# Patient Record
Sex: Female | Born: 1975 | Race: White | Hispanic: No | Marital: Married | Smoking: Never smoker
Health system: Southern US, Community
[De-identification: ages and names within clinical notes are randomized; demographics above are authoritative.]

## PROBLEM LIST (undated history)

## (undated) DIAGNOSIS — K429 Umbilical hernia without obstruction or gangrene: Principal | ICD-10-CM

## (undated) HISTORY — DX: Umbilical hernia without obstruction or gangrene: K42.9

## (undated) HISTORY — PX: TUBAL LIGATION: SHX77

---

## 2004-12-06 ENCOUNTER — Ambulatory Visit: Payer: Self-pay | Admitting: Family Medicine

## 2005-01-13 ENCOUNTER — Ambulatory Visit: Payer: Self-pay | Admitting: Family Medicine

## 2005-01-13 ENCOUNTER — Other Ambulatory Visit: Admission: RE | Admit: 2005-01-13 | Discharge: 2005-01-13 | Payer: Self-pay | Admitting: Family Medicine

## 2005-01-23 ENCOUNTER — Ambulatory Visit: Payer: Self-pay | Admitting: Family Medicine

## 2005-12-10 ENCOUNTER — Ambulatory Visit: Payer: Self-pay | Admitting: Family Medicine

## 2006-01-15 ENCOUNTER — Other Ambulatory Visit: Admission: RE | Admit: 2006-01-15 | Discharge: 2006-01-15 | Payer: Self-pay | Admitting: Family Medicine

## 2006-01-15 ENCOUNTER — Ambulatory Visit: Payer: Self-pay | Admitting: Family Medicine

## 2006-01-15 ENCOUNTER — Encounter (INDEPENDENT_AMBULATORY_CARE_PROVIDER_SITE_OTHER): Payer: Self-pay | Admitting: *Deleted

## 2006-08-21 ENCOUNTER — Ambulatory Visit (HOSPITAL_COMMUNITY): Admission: RE | Admit: 2006-08-21 | Discharge: 2006-08-21 | Payer: Self-pay | Admitting: Obstetrics and Gynecology

## 2006-10-01 ENCOUNTER — Inpatient Hospital Stay (HOSPITAL_COMMUNITY): Admission: RE | Admit: 2006-10-01 | Discharge: 2006-10-03 | Payer: Self-pay | Admitting: Obstetrics and Gynecology

## 2007-04-13 ENCOUNTER — Encounter: Payer: Self-pay | Admitting: Obstetrics and Gynecology

## 2007-04-13 ENCOUNTER — Ambulatory Visit: Payer: Self-pay | Admitting: Obstetrics and Gynecology

## 2007-11-16 ENCOUNTER — Ambulatory Visit: Payer: Self-pay | Admitting: Family Medicine

## 2007-11-16 DIAGNOSIS — E7800 Pure hypercholesterolemia, unspecified: Secondary | ICD-10-CM

## 2007-11-16 LAB — CONVERTED CEMR LAB
ALT: 8 units/L (ref 0–35)
AST: 10 units/L (ref 0–37)
Albumin: 4.4 g/dL (ref 3.5–5.2)
Cholesterol: 204 mg/dL — ABNORMAL HIGH (ref 0–200)
Glucose, Bld: 100 mg/dL — ABNORMAL HIGH (ref 70–99)
Potassium: 4.8 meq/L (ref 3.5–5.3)
Sodium: 140 meq/L (ref 135–145)
Total CHOL/HDL Ratio: 4.7
Total Protein: 7 g/dL (ref 6.0–8.3)

## 2007-11-17 ENCOUNTER — Encounter: Payer: Self-pay | Admitting: Family Medicine

## 2008-01-18 ENCOUNTER — Ambulatory Visit: Payer: Self-pay | Admitting: Obstetrics and Gynecology

## 2008-01-19 ENCOUNTER — Encounter: Payer: Self-pay | Admitting: Family Medicine

## 2008-02-18 ENCOUNTER — Ambulatory Visit: Payer: Self-pay | Admitting: Physician Assistant

## 2008-02-23 ENCOUNTER — Ambulatory Visit (HOSPITAL_COMMUNITY): Admission: RE | Admit: 2008-02-23 | Discharge: 2008-02-23 | Payer: Self-pay | Admitting: Obstetrics & Gynecology

## 2008-03-17 ENCOUNTER — Ambulatory Visit: Payer: Self-pay | Admitting: Physician Assistant

## 2008-03-22 ENCOUNTER — Ambulatory Visit (HOSPITAL_COMMUNITY): Admission: RE | Admit: 2008-03-22 | Discharge: 2008-03-22 | Payer: Self-pay | Admitting: Obstetrics & Gynecology

## 2008-04-05 ENCOUNTER — Ambulatory Visit (HOSPITAL_COMMUNITY): Admission: RE | Admit: 2008-04-05 | Discharge: 2008-04-05 | Payer: Self-pay | Admitting: Obstetrics & Gynecology

## 2008-04-14 ENCOUNTER — Encounter: Payer: Self-pay | Admitting: Family

## 2008-04-14 ENCOUNTER — Ambulatory Visit: Payer: Self-pay | Admitting: Family

## 2008-05-19 ENCOUNTER — Ambulatory Visit: Payer: Self-pay | Admitting: Family

## 2008-06-09 ENCOUNTER — Ambulatory Visit: Payer: Self-pay | Admitting: Family

## 2008-06-09 LAB — CONVERTED CEMR LAB
HCT: 35 % — ABNORMAL LOW (ref 36.0–46.0)
Hemoglobin: 11.3 g/dL — ABNORMAL LOW (ref 12.0–15.0)
Platelets: 263 10*3/uL (ref 150–400)
RBC: 3.65 M/uL — ABNORMAL LOW (ref 3.87–5.11)
WBC: 8.6 10*3/uL (ref 4.0–10.5)

## 2008-06-23 ENCOUNTER — Ambulatory Visit: Payer: Self-pay | Admitting: Obstetrics and Gynecology

## 2008-07-10 ENCOUNTER — Ambulatory Visit: Payer: Self-pay | Admitting: Obstetrics and Gynecology

## 2008-07-24 ENCOUNTER — Ambulatory Visit: Payer: Self-pay | Admitting: Family

## 2008-08-07 ENCOUNTER — Ambulatory Visit: Payer: Self-pay | Admitting: Family

## 2008-08-08 ENCOUNTER — Encounter: Payer: Self-pay | Admitting: Family

## 2008-08-08 LAB — CONVERTED CEMR LAB: GC Probe Amp, Genital: NEGATIVE

## 2008-08-09 ENCOUNTER — Encounter: Payer: Self-pay | Admitting: Family

## 2008-08-14 ENCOUNTER — Ambulatory Visit: Payer: Self-pay | Admitting: Obstetrics and Gynecology

## 2008-08-21 ENCOUNTER — Ambulatory Visit: Payer: Self-pay | Admitting: Family

## 2008-08-28 ENCOUNTER — Ambulatory Visit: Payer: Self-pay | Admitting: Obstetrics and Gynecology

## 2008-09-04 ENCOUNTER — Ambulatory Visit: Payer: Self-pay | Admitting: Family

## 2008-09-04 ENCOUNTER — Ambulatory Visit (HOSPITAL_COMMUNITY): Admission: RE | Admit: 2008-09-04 | Discharge: 2008-09-04 | Payer: Self-pay | Admitting: Obstetrics & Gynecology

## 2008-09-06 ENCOUNTER — Ambulatory Visit: Payer: Self-pay | Admitting: Obstetrics & Gynecology

## 2008-09-06 ENCOUNTER — Inpatient Hospital Stay (HOSPITAL_COMMUNITY): Admission: RE | Admit: 2008-09-06 | Discharge: 2008-09-10 | Payer: Self-pay | Admitting: Obstetrics & Gynecology

## 2008-09-06 ENCOUNTER — Ambulatory Visit: Payer: Self-pay | Admitting: Family Medicine

## 2008-10-27 ENCOUNTER — Ambulatory Visit: Payer: Self-pay | Admitting: Physician Assistant

## 2009-06-04 ENCOUNTER — Telehealth: Payer: Self-pay | Admitting: Family Medicine

## 2009-07-17 ENCOUNTER — Ambulatory Visit: Payer: Self-pay | Admitting: Obstetrics & Gynecology

## 2009-07-17 ENCOUNTER — Encounter: Payer: Self-pay | Admitting: Physician Assistant

## 2009-07-17 LAB — CONVERTED CEMR LAB
Antibody Screen: NEGATIVE
Basophils Absolute: 0 10*3/uL (ref 0.0–0.1)
Eosinophils Absolute: 0.1 10*3/uL (ref 0.0–0.7)
Lymphocytes Relative: 25 % (ref 12–46)
Lymphs Abs: 1.9 10*3/uL (ref 0.7–4.0)
MCV: 94.9 fL (ref 78.0–100.0)
Monocytes Absolute: 0.4 10*3/uL (ref 0.1–1.0)
Neutro Abs: 5.2 10*3/uL (ref 1.7–7.7)
RBC: 4.53 M/uL (ref 3.87–5.11)
RDW: 13.2 % (ref 11.5–15.5)

## 2009-07-18 ENCOUNTER — Encounter: Payer: Self-pay | Admitting: Physician Assistant

## 2009-07-26 IMAGING — US US FETAL BPP W/O NONSTRESS
1 series · 5 of 5 positions shown · non-contrast
Comparison: none

OBSTETRICAL ULTRASOUND:
 This ultrasound exam was performed in the [HOSPITAL] Ultrasound Department.  The OB US report was generated in the AS system, and faxed to the ordering physician.  This report is also available in [REDACTED] PACS.

[Series 1: us fetal bpp w/o nonstress · non-contrast · 5 acquisitions, 5 frames shown]
[im 1/5]
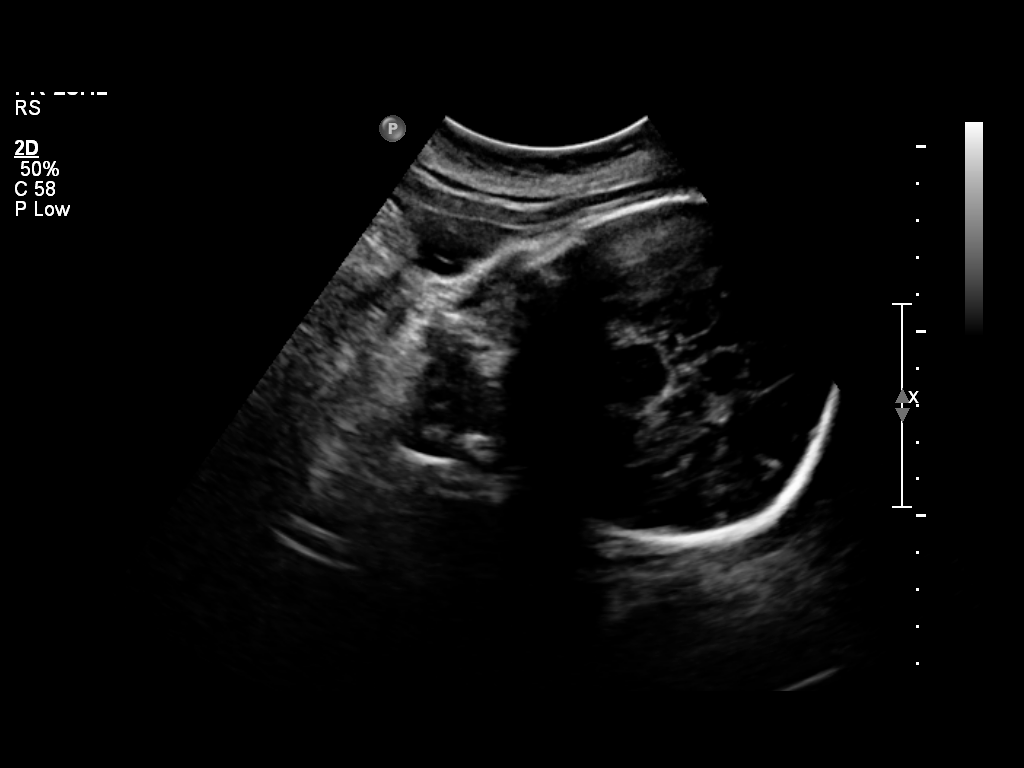
[im 2/5]
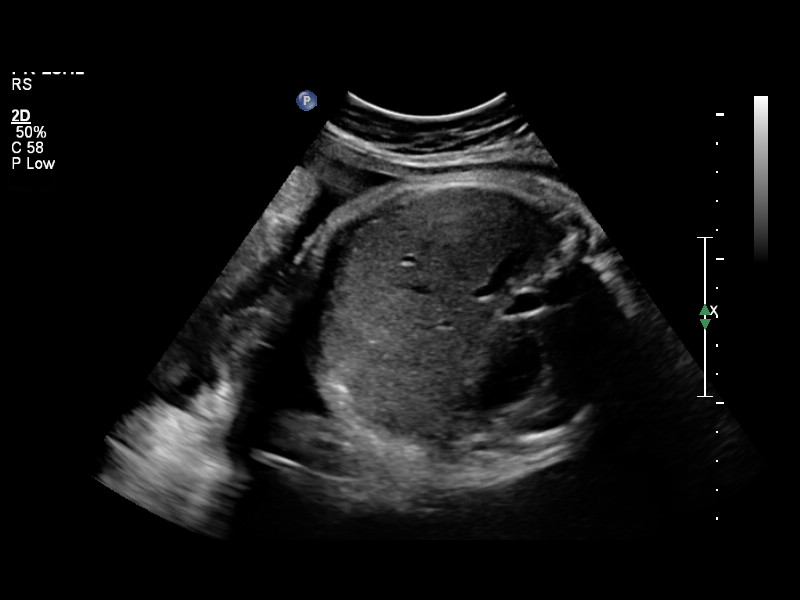
[im 3/5]
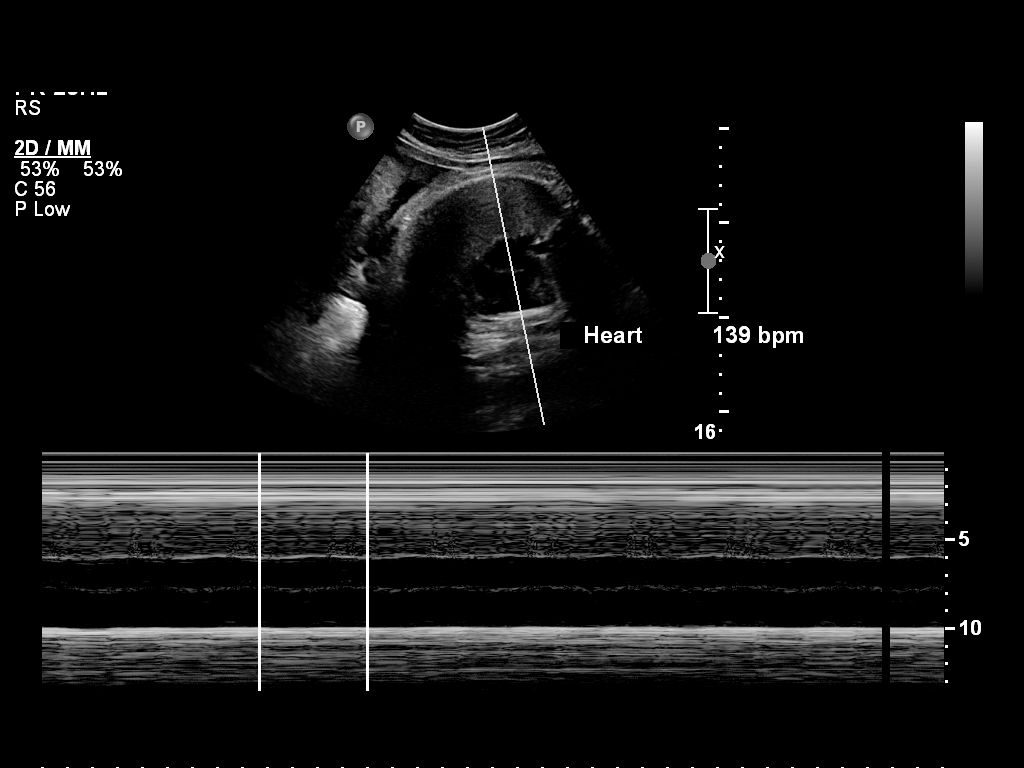
[im 4/5]
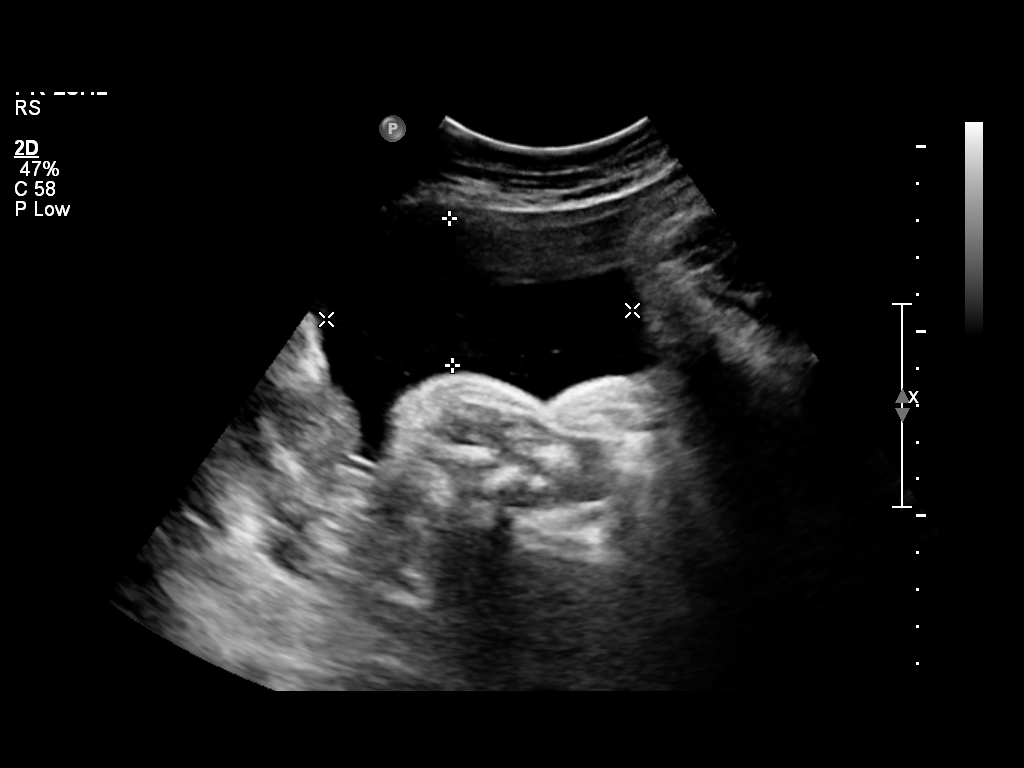
[im 5/5]
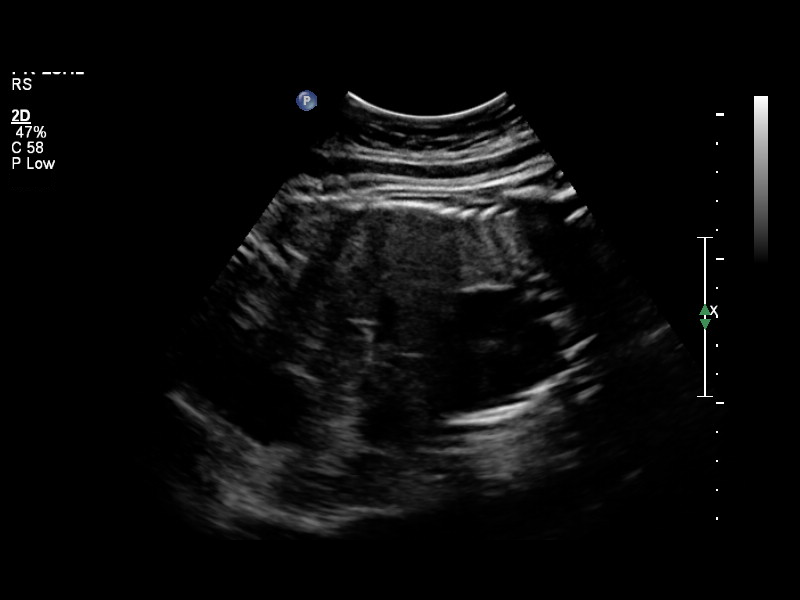

[5 of 5 positions shown; findings below may reference images not displayed]

IMPRESSION: See AS Obstetric US report.

## 2009-07-27 ENCOUNTER — Ambulatory Visit: Payer: Self-pay | Admitting: Physician Assistant

## 2009-08-09 ENCOUNTER — Encounter: Payer: Self-pay | Admitting: Obstetrics and Gynecology

## 2009-08-20 ENCOUNTER — Ambulatory Visit (HOSPITAL_COMMUNITY): Admission: RE | Admit: 2009-08-20 | Discharge: 2009-08-20 | Payer: Self-pay | Admitting: Obstetrics & Gynecology

## 2009-08-24 ENCOUNTER — Ambulatory Visit: Payer: Self-pay | Admitting: Advanced Practice Midwife

## 2009-09-24 ENCOUNTER — Ambulatory Visit: Payer: Self-pay | Admitting: Family

## 2009-10-01 ENCOUNTER — Ambulatory Visit (HOSPITAL_COMMUNITY): Admission: RE | Admit: 2009-10-01 | Discharge: 2009-10-01 | Payer: Self-pay | Admitting: Obstetrics & Gynecology

## 2009-10-26 ENCOUNTER — Ambulatory Visit: Payer: Self-pay | Admitting: Advanced Practice Midwife

## 2009-11-20 ENCOUNTER — Ambulatory Visit: Payer: Self-pay | Admitting: Obstetrics & Gynecology

## 2009-11-27 ENCOUNTER — Ambulatory Visit (HOSPITAL_COMMUNITY): Admission: RE | Admit: 2009-11-27 | Discharge: 2009-11-27 | Payer: Self-pay | Admitting: Obstetrics & Gynecology

## 2009-12-14 ENCOUNTER — Ambulatory Visit: Payer: Self-pay | Admitting: Physician Assistant

## 2009-12-15 ENCOUNTER — Encounter: Payer: Self-pay | Admitting: Physician Assistant

## 2009-12-15 LAB — CONVERTED CEMR LAB
HCT: 37.8 % (ref 36.0–46.0)
Hemoglobin: 11.7 g/dL — ABNORMAL LOW (ref 12.0–15.0)
WBC: 7.6 10*3/uL (ref 4.0–10.5)

## 2009-12-21 ENCOUNTER — Encounter: Payer: Self-pay | Admitting: Obstetrics & Gynecology

## 2009-12-28 ENCOUNTER — Ambulatory Visit: Payer: Self-pay | Admitting: Family

## 2010-01-01 ENCOUNTER — Ambulatory Visit (HOSPITAL_COMMUNITY): Admission: RE | Admit: 2010-01-01 | Discharge: 2010-01-01 | Payer: Self-pay | Admitting: Obstetrics & Gynecology

## 2010-01-01 ENCOUNTER — Encounter: Admission: RE | Admit: 2010-01-01 | Discharge: 2010-02-28 | Payer: Self-pay | Admitting: Obstetrics & Gynecology

## 2010-01-04 ENCOUNTER — Ambulatory Visit: Payer: Self-pay | Admitting: Advanced Practice Midwife

## 2010-01-11 ENCOUNTER — Ambulatory Visit: Payer: Self-pay | Admitting: Physician Assistant

## 2010-01-23 ENCOUNTER — Ambulatory Visit: Payer: Self-pay | Admitting: Obstetrics & Gynecology

## 2010-02-08 ENCOUNTER — Ambulatory Visit: Payer: Self-pay | Admitting: Family

## 2010-02-08 ENCOUNTER — Encounter: Payer: Self-pay | Admitting: Physician Assistant

## 2010-02-08 LAB — CONVERTED CEMR LAB: GC Probe Amp, Genital: NEGATIVE

## 2010-02-15 ENCOUNTER — Ambulatory Visit: Payer: Self-pay | Admitting: Physician Assistant

## 2010-02-16 ENCOUNTER — Encounter: Payer: Self-pay | Admitting: Physician Assistant

## 2010-02-16 LAB — CONVERTED CEMR LAB
AST: 18 units/L (ref 0–37)
Albumin: 3.1 g/dL — ABNORMAL LOW (ref 3.5–5.2)
CO2: 22 meq/L (ref 19–32)
Creatinine, Urine: 168.6 mg/dL
MCHC: 31.8 g/dL (ref 30.0–36.0)
MCV: 101.5 fL — ABNORMAL HIGH (ref 78.0–100.0)
Platelets: 194 10*3/uL (ref 150–400)
RBC: 3.94 M/uL (ref 3.87–5.11)
RDW: 14.1 % (ref 11.5–15.5)
Total Protein: 5.5 g/dL — ABNORMAL LOW (ref 6.0–8.3)

## 2010-02-22 ENCOUNTER — Ambulatory Visit: Payer: Self-pay | Admitting: Obstetrics and Gynecology

## 2010-02-28 ENCOUNTER — Inpatient Hospital Stay (HOSPITAL_COMMUNITY): Admission: RE | Admit: 2010-02-28 | Discharge: 2010-03-02 | Payer: Self-pay | Admitting: Family Medicine

## 2010-02-28 ENCOUNTER — Ambulatory Visit: Payer: Self-pay | Admitting: Family Medicine

## 2010-02-28 ENCOUNTER — Other Ambulatory Visit: Payer: Self-pay | Admitting: Obstetrics & Gynecology

## 2010-03-05 ENCOUNTER — Ambulatory Visit: Payer: Self-pay | Admitting: Obstetrics & Gynecology

## 2010-03-06 ENCOUNTER — Ambulatory Visit: Admission: RE | Admit: 2010-03-06 | Discharge: 2010-03-06 | Payer: Self-pay | Admitting: Family Medicine

## 2010-04-09 ENCOUNTER — Ambulatory Visit: Payer: Self-pay | Admitting: Obstetrics & Gynecology

## 2010-04-23 ENCOUNTER — Encounter: Payer: Self-pay | Admitting: Physician Assistant

## 2010-04-23 LAB — CONVERTED CEMR LAB
Glucose, 2 hour: 87 mg/dL (ref 70–139)
Glucose, Fasting: 68 mg/dL — ABNORMAL LOW (ref 70–99)

## 2010-06-23 ENCOUNTER — Encounter: Payer: Self-pay | Admitting: Obstetrics & Gynecology

## 2010-06-23 ENCOUNTER — Encounter: Payer: Self-pay | Admitting: Diagnostic Radiology

## 2010-07-02 NOTE — Progress Notes (Signed)
Summary: Rx for pink eye  Phone Note Call from Patient   Caller: Patient Summary of Call: Pt LMOM requesting eye drops for pink eye. Pt states she in unable to leave work for OV and husband is bringing child in apt w/ you this afternoon. Please advise.  Initial call taken by: Payton Spark CMA,  June 04, 2009 12:18 PM    New/Updated Medications: POLYTRIM 10000-0.1 UNIT/ML-% SOLN (POLYMYXIN B-TRIMETHOPRIM) 1 drop in each eye q 3 hrs x 7 days Prescriptions: POLYTRIM 10000-0.1 UNIT/ML-% SOLN (POLYMYXIN B-TRIMETHOPRIM) 1 drop in each eye q 3 hrs x 7 days  #1 bottle x 0   Entered and Authorized by:   Seymour Bars DO   Signed by:   Seymour Bars DO on 06/04/2009   Method used:   Electronically to        [facility] [phone]* (retail)       [address]       [place], [place]  [address]       Ph: [phone] or [phone]       Fax: [phone]   RxID:   [id number]

## 2010-08-15 LAB — BASIC METABOLIC PANEL
BUN: 10 mg/dL (ref 6–23)
CO2: 24 mEq/L (ref 19–32)
Calcium: 8.9 mg/dL (ref 8.4–10.5)
Creatinine, Ser: 0.73 mg/dL (ref 0.4–1.2)
GFR calc Af Amer: >60 mL/min (ref >60)
Glucose, Bld: 78 mg/dL (ref 70–99)

## 2010-08-15 LAB — CBC
HCT: 38.9 % (ref 36.0–46.0)
Hemoglobin: 11 g/dL — ABNORMAL LOW (ref 12.0–15.0)
MCH: 33.6 pg (ref 26.0–34.0)
MCH: 34.5 pg — ABNORMAL HIGH (ref 26.0–34.0)
MCHC: 34.2 g/dL (ref 30.0–36.0)
MCV: 98.2 fL (ref 78.0–100.0)
MCV: 99.3 fL (ref 78.0–100.0)
RBC: 3.18 MIL/uL — ABNORMAL LOW (ref 3.87–5.11)
RDW: 13 % (ref 11.5–15.5)
WBC: 6.8 10*3/uL (ref 4.0–10.5)

## 2010-08-15 LAB — CROSSMATCH

## 2010-08-15 LAB — SYPHILIS: RPR W/REFLEX TO RPR TITER AND TREPONEMAL ANTIBODIES, TRADITIONAL SCREENING AND DIAGNOSIS ALGORITHM: RPR Ser Ql: NONREACTIVE

## 2010-08-15 LAB — ABO/RH: ABO/RH(D): O POS

## 2010-08-15 LAB — GLUCOSE, CAPILLARY: Glucose-Capillary: 91 mg/dL (ref 70–99)

## 2010-08-15 LAB — SURGICAL PCR SCREEN: MRSA, PCR: NEGATIVE

## 2010-09-11 LAB — CBC
HCT: 37 % (ref 36.0–46.0)
HCT: 37.9 % (ref 36.0–46.0)
Hemoglobin: 10.2 g/dL — ABNORMAL LOW (ref 12.0–15.0)
Hemoglobin: 12.6 g/dL (ref 12.0–15.0)
MCHC: 33.5 g/dL (ref 30.0–36.0)
MCHC: 34 g/dL (ref 30.0–36.0)
MCV: 96.8 fL (ref 78.0–100.0)
Platelets: 199 10*3/uL (ref 150–400)
Platelets: 242 10*3/uL (ref 150–400)
Platelets: 250 10*3/uL (ref 150–400)
RDW: 13.7 % (ref 11.5–15.5)
RDW: 14.1 % (ref 11.5–15.5)
RDW: 14.5 % (ref 11.5–15.5)
WBC: 13.1 10*3/uL — ABNORMAL HIGH (ref 4.0–10.5)
WBC: 7.5 10*3/uL (ref 4.0–10.5)

## 2010-09-11 LAB — COMPREHENSIVE METABOLIC PANEL
ALT: 14 U/L (ref 0–35)
Albumin: 2.7 g/dL — ABNORMAL LOW (ref 3.5–5.2)
Alkaline Phosphatase: 119 U/L — ABNORMAL HIGH (ref 39–117)
BUN: 6 mg/dL (ref 6–23)
Chloride: 105 mEq/L (ref 96–112)
Potassium: 3.9 mEq/L (ref 3.5–5.1)
Sodium: 133 mEq/L — ABNORMAL LOW (ref 135–145)
Total Bilirubin: 0.2 mg/dL — ABNORMAL LOW (ref 0.3–1.2)
Total Protein: 6 g/dL (ref 6.0–8.3)

## 2010-09-11 LAB — URINALYSIS, ROUTINE W REFLEX MICROSCOPIC
Bilirubin Urine: NEGATIVE
Glucose, UA: NEGATIVE mg/dL
Ketones, ur: NEGATIVE mg/dL
Leukocytes, UA: NEGATIVE
Specific Gravity, Urine: 1.01 (ref 1.005–1.030)
pH: 6.5 (ref 5.0–8.0)

## 2010-09-11 LAB — URINE MICROSCOPIC-ADD ON

## 2010-09-11 LAB — MAGNESIUM: Magnesium: 5.8 mg/dL — ABNORMAL HIGH (ref 1.5–2.5)

## 2010-09-11 LAB — RPR: RPR Ser Ql: NONREACTIVE

## 2010-10-01 ENCOUNTER — Inpatient Hospital Stay (HOSPITAL_COMMUNITY): Admission: AD | Admit: 2010-10-01 | Payer: Self-pay | Source: Home / Self Care | Admitting: Obstetrics & Gynecology

## 2010-10-15 NOTE — Assessment & Plan Note (Signed)
Caitlyn Perez, Caitlyn Perez             ACCOUNT NO.:  [id number]   MEDICAL RECORD NO.:  [id number]          PATIENT TYPE:  POB   LOCATION:  [facility] at [place]         FACILITY:  [facility]   PHYSICIAN:  Allie Bossier, MD        DATE OF BIRTH:  [birth year]   DATE OF SERVICE:  04/09/2010                                  CLINIC NOTE   HISTORY OF PRESENT ILLNESS:  Caitlyn Perez is a 35 year old married white G2,  P2 who is 5 weeks postop status post repeat cesarean section as well as  tubal ligation.  She comes in here for her scheduled followup.  She has  no GYN complaints.  She did have a question about breast-feeding.  The  baby is no longer technically breast-feeding; however, she is pumping  every 3-4 hours and feeding him through the bottle.  She had a question  of milk gland and/or duct in her left breast.  She said she was still a  little sore for the last day.  She denies baby blues.  She has not had  intercourse.  She denies bowel or bladder dysfunction.  Physical exam,  both breasts are normal appearing, lactating breasts.  I do not detect  any areas of current mastitis; however, her incision is very well-  healed.  Her abdomen is benign.  Her uterus is well involuted.   ASSESSMENT/PLAN:  1. Postpartum postop stable.  2. Breast pain.   I have recommended hot compresses and continue pumping, and she should  return should she develop redness or fever in her breast.  Because she  had gestational diabetes in her pregnancy, she will come back in the  next several weeks for a 2-hour Glucola and her Pap smear is due to be  repeated in March of next year.      Allie Bossier, MD     MCD/MEDQ  D:  04/09/2010  T:  04/10/2010  Job:  [id number]

## 2010-10-15 NOTE — Discharge Summary (Signed)
Caitlyn, GALLOP             ACCOUNT NO.:  [id number]   MEDICAL RECORD NO.:  [id number]          PATIENT TYPE:  INP   LOCATION:  9109                          FACILITY:  [facility]   PHYSICIAN:  Lazaro Arms, M.D.   DATE OF BIRTH:  [birth year]   DATE OF ADMISSION:  09/06/2008  DATE OF DISCHARGE:  09/10/2008                               DISCHARGE SUMMARY   REASON FOR ADMISSION:  Pregnancy at 40 plus weeks, for induction of  labor.   DISCHARGE DIAGNOSIS:  Term pregnancy, primary low transverse cesarean  section by Dr. Silas Flood for failure to progress.   HOSPITAL COURSE:  The patient labored to approximately until 6-7 cm and  failed to pass that point and at that time showed a low transverse  cesarean section by Dr. Billy Coast and Dr. Silas Flood.  The patient was noted to  have blood pressure elevations in recovery room.  At that time, she was  started on mag sulfate, transferred to ICU for 24 hours.  The patient  responded well to mag sulfate therapy.  Blood pressures since discharged  24 hours ago have been in the 110s/70s and hemoglobin is 10.3.  The  patient is up, ambulating well, taking p.o. fluids and solids well,  emptying her bladder well and has had 2 bowel movements since her  surgery.   PHYSICAL EXAMINATION:  HEART:  Regular to rhythm and rate.  LUNGS:  Clear to auscultation bilaterally.  ABDOMEN:  Soft and nontender.  Bowel sounds are present in all 4  quadrants.  Fundus is firm at -3.  Lochia, scant amount.  EXTREMITIES:  Negative Homans, 2+ DTRs.  There is no pitting edema  noted.   DISCHARGE MEDICATIONS:  1. Prevagen Forte 1 p.o. b.i.d.  2. Percocet 5/325 one p.o. q.4-6 h. p.r.n. pain.  3. Motrin 600 one p.o. q.6 h. p.r.n. cramping.   DISCHARGE FOLLOWUP:  She is to follow up at [facility] office in 6  weeks or p.r.n. as needed.      [name], N.M.      Lazaro Arms, M.D.  Electronically Signed    DL/MEDQ  D:  95/[id number]/[id number]  T:  09/11/2008  Job:  [id number]   cc:   Norton Blizzard, MD

## 2010-10-15 NOTE — Assessment & Plan Note (Signed)
Caitlyn Perez, Caitlyn Perez             ACCOUNT NO.:  [id number]   MEDICAL RECORD NO.:  [id number]          PATIENT TYPE:  POB   LOCATION:  [facility] at [facility] [facility]         FACILITY:  [facility]   PHYSICIAN:  Caren Griffins, CNM       DATE OF BIRTH:  Apr 17, 1976   DATE OF SERVICE:  04/13/2007                                  CLINIC NOTE   REASON FOR VISIT:  Annual GYN exam.   HISTORY:  This is a 35 year old nulliparous female who presents for  routine visit and Pap smear.  Last Pap was August 2007 and was normal,  she has never had an abnormal Pap.  She was married one year ago, at  which time she moved to this area.  Uses condoms for contraception, has  no concerns other than questioning the normalcy that she has a light  flow of menses but this has been unchanged over several years.   ALLERGIES:  NONE.   MEDICATIONS:  None, she does take multivitamins with calcium but often  forgets.   IMMUNIZATIONS:  She has had the usual childhood immunizations and has  had tetanus in the last 10 years.  Declines flu vaccine.   MENSTRUAL HISTORY:  LMP March 19, 2007, menses 13 times 23 times 4  with light flow and moderate dysmenorrhea, she has no intermenstrual  bleeding.   CONTRACEPTION HISTORY:  Has not used hormonal contraception.  She has  had 5 lifetime partners.  Has been married for one year in a mutually  monogamous relationship.   GYN HISTORY:  As above and negative for STIs.   SURGERIES:  None.   FAMILY HISTORY:  Significant for diabetes, cardiovascular disease, MI,  hypertension and lung cancer in grandmother.   PERSONAL MEDICAL HISTORY:  She states that her cholesterol was elevated  when she was tested by her primary MD in [place].  She plans on  transferring her primary care to this area and declines lipid testing  today.  PMH is otherwise negative.   SOCIAL HISTORY:  Lives with husband, is concerned about her finances  having mortgages on 2 homes now.  She does work in [employer] as an  Engineer, manufacturing.  She is a nonsmoker, drinks  alcohol 1-2 drinks a week, caffeine once a day.  No illicit drug use or  abuse history.   Ten point review of systems is negative.   PHYSICAL EXAMINATION:  VITAL SIGNS:  Temperature 98.6, pulse 74, blood  pressure 128/81, BMI is 25.  GENERAL:  WNWD, pleasant WF in NAD.  HEENT:  Dentition good; thyroid smooth, nonpalpable.  LUNGS:  Clear to auscultation bilaterally.  CORONARY:  RRR without murmur.  BREASTS:  Symmetric, no discrete mass, no lymphadenopathy, no nipple  discharge.  ABDOMEN:  Soft, flat, nontender without masses or organomegaly.  PELVIC:  NEFG, BUS negative.  Vagina pink, well rugated.  Cervix  nulliparous, no lesions.  Bimanual:  Uterus NSSP, adnexa no tenderness  or masses.  EXTREMITIES:  Trace sock line edema; pulses equal, strong bilaterally.   ASSESSMENT:  Normal gynecological exam.   PLAN:  Counseled on continuing a healthy diet, continuing her cardio  exercising and increasing her weight  training which she does rarely.  Discussed baseline mammogram age 41, she is undecided.  Pap smear sent  today, other STI testing is declined.  Breast self exam reviewed.  Continue seat belts.  Discussed her contraceptive and its poor use  effectiveness, she is not interested in other methods at this time.           ______________________________  Caren Griffins, CNM     DP/MEDQ  D:  04/13/2007  T:  04/14/2007  Job:  [id number]

## 2010-10-15 NOTE — Assessment & Plan Note (Signed)
Caitlyn Perez, Caitlyn Perez             ACCOUNT NO.:  [id number]   MEDICAL RECORD NO.:  [id number]          PATIENT TYPE:  POB   LOCATION:  [facility] [place]         FACILITY:  [facility]   PHYSICIAN:  Maylon Cos, CNM    DATE OF BIRTH:  1975-08-22   DATE OF SERVICE:  10/27/2008                                  CLINIC NOTE   The patient presents for a 6-week postpartum visit with no complaints.   HISTORY OF PRESENT ILLNESS:  The patient is 6 weeks status post a low  transverse cesarean section for failure to progress.  The patient  delivered at [facility].  She delivered a female infant who is  present during today's visit and doing very well.  She is solely  breastfeeding without difficulties.  She states that vaginal bleeding  ended approximately 3 weeks ago.  She has not resumed periods yet.  She  does not desire hormonal contraceptives.  She and her husband plan to  use condoms and spermicide for contraception in the meantime, as they  are planning to have another pregnancy approximately 1 year from now.  They have resumed intercourse with no problems and no complaints.  She  has been exercising daily, light cardio activity, and does desire to  return to her full exercise routine.  She has lost 30 pounds since the  birth and would desire clearance for abdominal exercises.  She states  that she feels like she is getting adequate rest and nutrition and had  some minor postpartum blues the first week after coming home but has a  good support of her family and husband and denies symptoms of postpartum  depression and feels like that they are coping well with having the baby  in the house.  She would also like a note to return to work today  without restrictions.   PHYSICAL EXAMINATION:  GENERAL:  Ms. Caitlyn Perez is a pleasant 35 year old  Caucasian female who appears to be younger than her stated age.  She is  in no apparent distress.  VITAL SIGNS:  Stable.  Pulse is 84, blood  pressure is 124/80, her weight  today is 137, and height is 62 inches.  HEENT:  Grossly normal with good dentition.  BREASTS:  Soft and nontender without lesions, masses, retractions,  dimpling.  Nipples are erect without discharge.  ABDOMEN:  Soft and nontender without masses.  No hepato or splenomegaly.  She has no diastasis.  Her Pfannenstiel incision is healing well and is  nontender to palpation.  GENITALIA:  Examination was deferred secondary to no complaints.  EXTREMITIES:  Warm to touch without edema and equal pulses.   ASSESSMENT AND PLAN:  Six weeks status post cesarean section, of a  female infant.  She is doing superbly.  She is solely breastfeeding and  desires to return to work.   PLAN:  1. Return to work today without restrictions, note given.  2. Resume full activity and exercise routine within reason, including      abdominal exercises.  3. Continue prenatal vitamins and iron.  4. Follow up in November for annual exam or p.r.n. problems.  ______________________________  Maylon Cos, CNM     SS/MEDQ  D:  12/25/2008  T:  12/26/2008  Job:  [id number]

## 2010-10-18 NOTE — H&P (Signed)
NAMERenelda Perez              ACCOUNT NO.:  [id number]   MEDICAL RECORD NO.:  [id number]          PATIENT TYPE:  INP   LOCATION:  NA                            [facility]AC[facility]   PHYSICIAN:  Sherron Monday, MD        DATE OF BIRTH:  06/04/1975   DATE OF ADMISSION:  10/01/2006  DATE OF DISCHARGE:                              HISTORY & PHYSICAL   ADMISSION DIAGNOSES:  1. Intrauterine pregnancy at term.  2. History of low transverse cesarean section, desires repeat.  3. Anxiety and depression.   PROCEDURE PLANNED:  Repeat low transverse cesarean section on Oct 01, 2006.  Date of admission is Oct 01, 2006.   HISTORY OF PRESENT ILLNESS:  A 36 year old, G2, P1, at 39.4 weeks for  repeat low transverse cesarean section.  She states she has had good  fetal movements.  No loss of fluid, no vaginal bleeding, and occasional  contractions.  Her pregnancy has been uncomplicated except for she had  lagging fundal height at one point and thought was small for gestational  age infant.  However, ultrasound performed revealed normal AFI and  normal estimated weight.  She was started on Zoloft secondary to  anxiety, which she feels like has been much improved.  She was also  evaluated by Dr. Ladona Ridgel in cardiology secondary to palpitations.   PAST MEDICAL HISTORY:  Significant for depression.   PAST SURGICAL HISTORY:  Low transverse cesarean section.   PAST OB/GYN HISTORY:  G1 was a low transverse cesarean section at term  secondary to failure to progress.  G2 is present pregnancy without  complications aside from the lagging fundal height in her pregnancy at  appointment.  For the last several weeks her fundal height has been  appropriate.  She has a history of an abnormal Pap smear and a culpo and  they have been normal since.  No history of any sexually transmitted  diseases.   MEDICATIONS:  Zoloft and prenatal vitamins.   ALLERGIES:  PENICILLIN AND SULFA.   SOCIAL HISTORY:  The patient  denies alcohol, tobacco, or drug use.   FAMILY HISTORY:  Significant for heart disease in a paternal aunt and a  maternal uncle.  Maternal uncle with lung cancer.  Maternal grandmother  with colon cancer.  Maternal grandfather with mouth cancer.  Paternal  grandfather with pancreatic cancer.  Maternal great-aunt with breast  cancer.   PRENATAL LABS:  Her hemoglobin is 13.9.  Platelets 150,000.  Blood type  is A positive.  Her antibody screen is negative.  Gonorrhea negative.  Chlamydia negative.  RPR is nonreactive.  Rubella immune.  Hepatitis C  surface antigen negative.  HIV nonreactive.  Cystic fibrosis screen is  negative.  First trimester screen is negative.  Glucola of 103.  Group B  strep culture was also negative.  Her ultrasounds- she had a first  trimester screen which was consistent with her dates with an [facility] of Oct 04, 2006, and showed normal neural tube thickness, normal IUP.  Her  anatomy scan was on May 11, 2006, at 13 and  1 week, revealing  estimated fetal weight of 306 grams, normal anatomy, anterior placenta,  and a female infant.  An ultrasound performed on August 21, 2006, when a  fundal height was measured to be behind her gestational age, revealed an  AFI of 11.02, in the 26th percentile, and estimated fetal weight of 2369  grams, in the 68th percentile, and was consistent with her dates as  well.   ASSESSMENT/PLAN:  A 35 year old, gravida 2, para 1-0-0-1, at 52 and 4  for a repeat low transverse cesarean section.  Discussed with the  patient the risks, benefits, and alternatives of this surgical procedure  including bleeding, infection, damage to the surrounding organs.  She  voiced understanding to all this and she plans to present for her repeat  section.      Sherron Monday, MD  Electronically Signed     JB/MEDQ  D:  09/30/2006  T:  09/30/2006  Job:  [id number]

## 2010-10-18 NOTE — Discharge Summary (Signed)
NAMERenelda Loma              ACCOUNT NO.:  [id number]   MEDICAL RECORD NO.:  [id number]          PATIENT TYPE:  INP   LOCATION:  9130                          [facility]ACILITY[facility]   PHYSICIAN:  Malachi Pro. Ambrose Mantle, M.D. DATE OF BIRTH:  [birth year]   DATE OF ADMISSION:  10/01/2006  DATE OF DISCHARGE:  10/03/2006                               DISCHARGE SUMMARY   A 35 year old white female, para 1, gravida 2, 39.4 weeks' gestation for  repeat low-transverse cervical C-section.  Blood group and type A+,  negative antibody, GC and chlamydia negative, RPR nonreactive, rubella  immune, hepatitis B surface antigen negative, HIV negative.  Cystic  fibrosis screen negative.  First trimester screen negative.  Glucola  103.  Group B strep negative.   The patient was admitted for repeat C-section by Dr. Ellyn Hack.  She  underwent a low transverse cervical C-section by Dr. Ellyn Hack with Dr.  Senaida Ores assisting under regional anesthesia.  Blood loss was  estimated at 700 mL.  Findings were normal uterus, tubes and ovaries;  living female infant; Apgars of 8 at one and 9 at five minutes; 8 pounds  1 ounce.  Postpartum, the patient did well.  She voided well, tolerated  a regular diet, passed flatus, had a bowel movement, and on the second  post operative day, she wanted discharge.  I did not remove her staples.  I asked her to return to the office in 2-3 days to have her staples  removed.   LABORATORY DATA:  Showed an initial hemoglobin of 12.5, hematocrit 37.1,  white count 10,400, platelet count 151,000.  Follow-up hemoglobin 9.6,  platelet count 116,000.  RPR was nonreactive.   FINAL DIAGNOSIS:  Intrauterine pregnancy at 39-1/2 weeks' gestation with  prior cesarean section, delivered vertex by C-section.   OPERATION:  Low transverse cervical cesarean section.   FINAL CONDITION:  Improved.   INSTRUCTIONS:  Include regular discharge instruction booklet.  Vicodin  5/500, 50 tablets 1 every 4-6 hours  as needed for pain.  Prenatal  vitamins.  Motrin 600 mg 40 tablets 1 every 6 hours as needed.  The  patient is also to take Zoloft 50 mg a day.  Prescriptions were written  by Dr. Ellyn Hack.  She is to return in 2-3 days to have her staples  removed.      Malachi Pro. Ambrose Mantle, M.D.  Electronically Signed     TFH/MEDQ  D:  10/03/2006  T:  10/03/2006  Job:  [id number]

## 2010-10-18 NOTE — Op Note (Signed)
[name], [name]             ACCOUNT NO.:  [id number]   MEDICAL RECORD NO.:  [id number]          PATIENT TYPE:  INP   LOCATION:  9373                          [facility]   PHYSICIAN:  Norton Blizzard, MD    DATE OF BIRTH:  07/06/1975   DATE OF PROCEDURE:  01/08/2009  DATE OF DISCHARGE:                               OPERATIVE REPORT   PREOPERATIVE DIAGNOSES:  1. Intrauterine pregnancy at 102 and 6/7th weeks gestational age.  2. Arrest of dilation.  3. Preeclampsia.   POSTOPERATIVE DIAGNOSES:  1. Intrauterine pregnancy at 74 and 6/7th weeks gestational age.  2. Arrest of dilation.  3. Preeclampsia.  4. Loose nuchal cord.   PROCEDURE:  Primary low-transverse cesarean section.   SURGEON:  Norton Blizzard, MD   ASSISTANT:  Odie Sera, DO   ANESTHESIA:  Epidural and local.   INDICATIONS FOR PROCEDURE:  Caitlyn Perez is a 35 year old gravida  1 now para 1 who was admitted at 3 and 3/[redacted] weeks gestational age for  induction of labor due to preeclampsia.  Her labor progressed over a  prolonged course and the patient dilated up to approximately 6.5 cm.  However, despite increasing doses of Pitocin up to 40 milliunits per  minute, the patient remained at 6.5 cm for over several hours and failed  to dilate any further.  Because of the inability to dilate her past 6.5  cm despite large doses of Pitocin, decision was made to proceed with  delivery by cesarean section .  The patient was counseled on the risks  and benefits of this procedure to include but not limited to bleeding,  infection, and damage to internal organs.  The patient voiced  understanding of these risks and desired to proceed with the surgery.   DESCRIPTION OF PROCEDURE:  The patient was taken to the operating room  where epidural anesthesia was redosed.  She was then prepped and draped  in the usual sterile manner.  Appropriate anesthesia was confirmed.  Time-out was conducted.  A Pfannenstiel  incision was made in the skin  and continued through the subcutaneous layers to the fascia.  The fascia  was then incised in the midline with a scalpel and the fascial incision  was extended laterally using manual traction.  The fascia was bluntly  dissected off the underlying rectus muscles and the rectus muscles were  spread in the midline.  The peritoneum was entered bluntly and the  peritoneum was also spread laterally using manual traction.  Appropriate  entry to the uterus was obtained and the bladder blade was placed.  A  transverse incision was made in the lower uterine segment with a  scalpel, continued down through the myometrium with the last layers  penetrated bluntly with a finger.  The uterine incision was then  extended bilaterally using manual traction.  The fetal head was grasped  and elevated out of the pelvis.  The bladder blade was removed and the  head was delivered with the assistance of fundal pressure.  The fetus  was found to be in a direct occiput posterior position.  The mouth and  nares were bulb suctioned.  A loose nuchal cord was noted and reduced  manually.  The shoulders and rest of the corpus were then delivered  without difficulty.  The mouth and nares were bulb suctioned again.  The  cord was clamped and cut and baby was handed to the awaiting NICU staff  with a good cry.  The placenta then delivered with the assistance of  fundal massage.  The placenta was intact and had three-vessel cord.  The  uterus was cleared of clots and debris using a dry lap sponge.  The  uterine incision was closed using a two-layer closure.  The first layer  was a 0 Monocryl suture in a running interlocking manner.  The second  layer was a noninterlocking imbricating layer .  Good hemostasis was  noted of the uterine incision.  Both ovaries and fallopian tubes were  identified and found to be grossly normal.  The peritoneum was then  closed using 0 Vicryl in a running  noninterlocking manner.  The muscles  were loosely approximated with the same suture.  The fascia was then  closed using 0 Vicryl in a running noninterlocking manner.  Some  bleeding from the subcutaneous layer was treated with electrocautery.  The skin was then closed using 2-0 Vicryl in a running subcuticular  manner.  The superior and inferior aspects of the incision were injected  with 5 mL each of 0.25% Marcaine.  A pressure dressing was applied.  All  sponge, instrument, and needle counts were correct x2.  There were no  immediate complication.   FINDINGS:  1. Clear amniotic fluid.  2. Viable female infant with weight 6 pounds and 13 ounces, Apgars 6      and 7.  3. Bilateral ovaries and fallopian tubes were grossly normal.   SPECIMEN:  Placenta to Labor and Delivery.   ESTIMATED BLOOD LOSS:  700 mL.   DISPOSITION:  The patient taken to PACU in good condition.      Odie Sera, DO  Electronically Signed     ______________________________  Norton Blizzard, MD    MC/MEDQ  D:  09/08/2008  T:  09/09/2008  Job:  [id number]

## 2010-10-18 NOTE — Op Note (Signed)
NAMERenelda Perez              ACCOUNT NO.:  [id number]   MEDICAL RECORD NO.:  [id number]          PATIENT TYPE:  INP   LOCATION:  9130                          FACILITY:  [facility]   PHYSICIAN:  Sherron Monday, MD        DATE OF BIRTH:  09-Nov-1975   DATE OF PROCEDURE:  10/01/2006  DATE OF DISCHARGE:                               OPERATIVE REPORT   PREOPERATIVE DIAGNOSES:  1. Intrauterine pregnancy at term.  2. History of low transverse cesarean section, desires repeat cesarean      section.   POSTOPERATIVE DIAGNOSES:  1. Intrauterine pregnancy at term.  2. History of low transverse cesarean section, desires repeat cesarean      section.  3. Status post repeat low transverse cesarean section.   PROCEDURE:  Repeat low transverse cesarean section.   SURGEON:  Sherron Monday, MD.   ASSISTANT:  Huel Cote, M.D.   ANESTHESIA:  Regional.   PATHOLOGY:  None.   ESTIMATED BLOOD LOSS:  700 mL.   IV FLUIDS:  50 mL.   URINE OUTPUT:  225 mL clear urine at the end of the procedure.   COMPLICATIONS:  None.   FINDINGS:  Normal uterus, tubes and ovaries.  Viable female infant at  7:51 with Apgars of 8 at one minute and 9 at 5 minutes, and a weight of  8 pounds 1 ounce.   DISPOSITION:  To PACU in stable condition.   PROCEDURE:  After risks, benefits, and alternatives of the procedure  were discussed and the patient wished to proceed.  She was transported  to the operating room where spinal anesthesia was placed and found to be  adequate.  She was then prepped and draped in the normal sterile  fashion.  A catheter was sterilely placed in her bladder.  A  Pfannenstiel skin incision was made approximately 2 fingerbreadths above  the pubic symphysis.  Her previous incision was much lower than this; so  it was discussed with the patient and the new incision was made, carried  through to the underlying layer of fascia sharply.  The fascia was  incised in the midline and the incision was  extended laterally with Mayo  scissors.  The inferior aspect of the fascial incision was grasped with  Kocher clamps, elevated and the rectus muscles were dissected off both  bluntly and sharply.  The superior aspect of the fascial incision was  grasped with Kocher clamps, elevated and the rectus muscles were  dissected off both bluntly and sharply.  The midline was easily  identified and the peritoneum was entered bluntly.  The peritoneal  incision was extended superiorly and inferiorly, with good visualization  of the bladder.  The Alexis retractor was placed without difficulty.  The vesicouterine peritoneum was elevated with some pickups, and using  Metzenbaum  scissors a bladder flap was created both bluntly and  sharply.  A median uterine incision was made transversely, and the  infant was delivered from the vertex presentation atraumatically.  The  mouth and nose were suctioned on the field, and the cord was clamped and  cut.  The baby was handed off to the awaiting NICU staff.  The placenta  was expressed from the uterus. The uterus was wiped out with a moist  laparotomy sponge.  The uterus was closed in 2 layers with 0 Monocryl.  Hemostasis was assured.  Clots were cleared from the peritoneal cavity.  The uterus, tubes and ovaries were inspected.  The peritoneum was  reapproximated with several interrupted sutures of 0 Vicryl.  The fascia  was closed with 0 Vicryl in a running fashion.  The subcuticular adipose  layer was irrigated and made hemostatic with Bovie cautery.  A running  stitch of 2-0 plain gut was used to close the subcuticular space.  The  skin was closed with staples.  The patient tolerated the procedure well.  Sponge, lap and needle counts were correct x2 by the operating room  staff.  The patient was transferred to the PACU in stable condition.      Sherron Monday, MD  Electronically Signed     JB/MEDQ  D:  10/01/2006  T:  10/01/2006  Job:  [id number]

## 2010-11-01 HISTORY — PX: OTHER SURGICAL HISTORY: SHX169

## 2010-11-12 DIAGNOSIS — K429 Umbilical hernia without obstruction or gangrene: Secondary | ICD-10-CM

## 2010-11-12 HISTORY — DX: Umbilical hernia without obstruction or gangrene: K42.9

## 2010-11-27 ENCOUNTER — Encounter (INDEPENDENT_AMBULATORY_CARE_PROVIDER_SITE_OTHER): Payer: Self-pay | Admitting: Surgery

## 2010-12-17 ENCOUNTER — Ambulatory Visit (INDEPENDENT_AMBULATORY_CARE_PROVIDER_SITE_OTHER): Payer: BC Managed Care – PPO | Admitting: Surgery

## 2010-12-17 ENCOUNTER — Encounter (INDEPENDENT_AMBULATORY_CARE_PROVIDER_SITE_OTHER): Payer: Self-pay | Admitting: Surgery

## 2010-12-17 VITALS — Temp 98.1°F

## 2010-12-17 DIAGNOSIS — K429 Umbilical hernia without obstruction or gangrene: Secondary | ICD-10-CM | POA: Insufficient documentation

## 2010-12-17 NOTE — Progress Notes (Signed)
HISTORY: Patient underwent umbilical hernia repair with mesh on June 12th.  No complaints.   PERTINENT REVIEW OF SYSTEMS: No drainage.  No pain.  Normal bowel function.   EXAM: Wound well healed without complication.  No seroma.  No recurrence with cough and Valsalva.   IMPRESSION: Umbilical hernia repair with mesh.   PLAN: Return as needed.  Instructions given.

## 2010-12-17 NOTE — Patient Instructions (Signed)
Apply Mederma or cocoa butter to wound.  Resume activity.  No abdominal exercises for one more month.

## 2011-06-24 ENCOUNTER — Ambulatory Visit (INDEPENDENT_AMBULATORY_CARE_PROVIDER_SITE_OTHER): Payer: BC Managed Care – PPO | Admitting: Obstetrics & Gynecology

## 2011-06-24 ENCOUNTER — Encounter: Payer: Self-pay | Admitting: Obstetrics & Gynecology

## 2011-06-24 DIAGNOSIS — Z0000 Encounter for general adult medical examination without abnormal findings: Secondary | ICD-10-CM

## 2011-06-24 DIAGNOSIS — K429 Umbilical hernia without obstruction or gangrene: Secondary | ICD-10-CM

## 2011-06-24 DIAGNOSIS — Z1272 Encounter for screening for malignant neoplasm of vagina: Secondary | ICD-10-CM

## 2011-06-24 DIAGNOSIS — Z113 Encounter for screening for infections with a predominantly sexual mode of transmission: Secondary | ICD-10-CM

## 2011-06-24 NOTE — Assessment & Plan Note (Signed)
Repaired June 2012

## 2011-06-24 NOTE — Patient Instructions (Signed)
Place premenopausal annual exam patient instructions here.  °

## 2011-06-24 NOTE — Progress Notes (Signed)
  Subjective:     Caitlyn Perez is a 36 y.o. female here for a routine exam.  Current complaints: none.  Personal health questionnaire reviewed: yes.   Gynecologic History Patient's last menstrual period was 06/06/2011. Contraception: tubal ligation Last Pap: 2011. Results were: normal Last mammogram: n/a.   Obstetric History OB History    Grav Para Term Preterm Abortions TAB SAB Ect Mult Living   2 2 2       2      # Outc Date GA Lbr Len/2nd Wgt Sex Del Anes PTL Lv   1 TRM 4/10           2 TRM 9/11               The following portions of the patient's history were reviewed and updated as appropriate: allergies, current medications, past family history, past medical history, past social history, past surgical history and problem list.  Review of Systems A comprehensive review of systems was negative.    Objective:    BP 120/80  Pulse 79  Temp(Src) 96.9 F (36.1 C) (Oral)  Resp 16  Ht 5\' 2"  (1.575 m)  Wt 131 lb (59.421 kg)  BMI 23.96 kg/m2  LMP 06/06/2011  Breastfeeding? Unknown General appearance: alert, cooperative, appears stated age and no distress Head: Normocephalic, without obvious abnormality, atraumatic Eyes: negative findings: conjunctivae and sclerae normal Throat: lips, mucosa, and tongue normal; teeth and gums normal Neck: no adenopathy, supple, symmetrical, trachea midline and thyroid not enlarged, symmetric, no tenderness/mass/nodules Lungs: clear to auscultation bilaterally Breasts: normal appearance, no masses or tenderness Heart: regular rate and rhythm Abdomen: soft, non-tender; bowel sounds normal; no masses,  no organomegaly Pelvic: cervix normal in appearance, external genitalia normal, no adnexal masses or tenderness, no bladder tenderness, no cervical motion tenderness, rectovaginal septum normal, urethra without abnormality or discharge, uterus normal size, shape, and consistency and vagina normal without discharge Extremities: extremities  normal, atraumatic, no cyanosis or edema Skin: Skin color, texture, turgor normal. No rashes or lesions Lymph nodes: Axillary adenopathy: none    Assessment:    Healthy female exam.    Plan:    Education reviewed: calcium supplements, self breast exams and skin cancer screening. Contraception: tubal ligation. Follow up in: 1 year.

## 2011-12-25 ENCOUNTER — Ambulatory Visit (INDEPENDENT_AMBULATORY_CARE_PROVIDER_SITE_OTHER): Payer: BC Managed Care – PPO | Admitting: Family Medicine

## 2011-12-25 ENCOUNTER — Encounter: Payer: Self-pay | Admitting: Family Medicine

## 2011-12-25 VITALS — BP 102/66 | HR 89 | Temp 97.9°F | Resp 17 | Ht 61.5 in | Wt 136.0 lb

## 2011-12-25 DIAGNOSIS — Z23 Encounter for immunization: Secondary | ICD-10-CM

## 2011-12-25 DIAGNOSIS — Z0000 Encounter for general adult medical examination without abnormal findings: Secondary | ICD-10-CM

## 2011-12-25 NOTE — Patient Instructions (Addendum)
We will call you with your lab results. If you don't here from us in about a week then please give us a call at [phone]. Start a regular exercise program and make sure you are eating a healthy diet Try to eat 4 servings of dairy a day . Your vaccines are up to date.   

## 2011-12-25 NOTE — Progress Notes (Signed)
  Subjective:     Caitlyn Perez is a 36 y.o. female and is here for a comprehensive physical exam. The patient reports no problems. Her pap smear is up to date. Has gained 20 lbs since quit pumping.  No hx of thyroid problems.   History   Social History  . Marital Status: Married    Spouse Name: N/A    Number of Children: 2  . Years of Education: N/A   Occupational History  . teacher     [employer]   Social History Main Topics  . Smoking status: Never Smoker   . Smokeless tobacco: Never Used  . Alcohol Use: 0.6 oz/week    1 Cans of beer per week     occassional  . Drug Use: No  . Sexually Active: Yes -- Female partner(s)   Other Topics Concern  . Not on file   Social History Narrative   Regular exercise, 3-5 times per week.     Health Maintenance  Topic Date Due  . Tetanus/tdap  10/10/1995  . Influenza Vaccine  03/02/2012  . Pap Smear  06/23/2014    The following portions of the patient's history were reviewed and updated as appropriate: allergies, current medications, past family history, past medical history, past social history, past surgical history and problem list.  Review of Systems A comprehensive review of systems was negative.   Objective:    BP 102/66  Pulse 89  Temp 97.9 F (36.6 C)  Resp 17  Ht 5' 1.5" (1.562 m)  Wt 136 lb (61.689 kg)  BMI 25.28 kg/m2  SpO2 100% General appearance: alert, cooperative and appears stated age Head: Normocephalic, without obvious abnormality, atraumatic Eyes: conj clear, EOMi, PEERLA Ears: normal TM's and external ear canals both ears Nose: Nares normal. Septum midline. Mucosa normal. No drainage or sinus tenderness. Throat: lips, mucosa, and tongue normal; teeth and gums normal Neck: no adenopathy, no carotid bruit, no JVD, supple, symmetrical, trachea midline and thyroid not enlarged, symmetric, no tenderness/mass/nodules Back: symmetric, no curvature. ROM normal. No CVA tenderness. Lungs: clear to  auscultation bilaterally Heart: regular rate and rhythm, S1, S2 normal, no murmur, click, rub or gallop Abdomen: soft, non-tender; bowel sounds normal; no masses,  no organomegaly Extremities: extremities normal, atraumatic, no cyanosis or edema Pulses: 2+ and symmetric Skin: Skin color, texture, turgor normal. No rashes or lesions Lymph nodes: Cervical, supraclavicular, and axillary nodes normal. Neurologic: Alert and oriented X 3, normal strength and tone. Normal symmetric reflexes. Normal coordination and gait    Assessment:    Healthy female exam.     Plan:     See After Visit Summary for Counseling Recommendations  Start a regular exercise program and make sure you are eating a healthy diet Try to eat 4 servings of dairy a day Your vaccines are up to date.   She has gained 20 lbs so will check TSH. She has lost 4 lbs with diet and exercise, so encouraged her to continue this.   Tdap updated today.

## 2011-12-26 ENCOUNTER — Ambulatory Visit (INDEPENDENT_AMBULATORY_CARE_PROVIDER_SITE_OTHER): Payer: BC Managed Care – PPO | Admitting: Family Medicine

## 2011-12-26 DIAGNOSIS — Z111 Encounter for screening for respiratory tuberculosis: Secondary | ICD-10-CM

## 2011-12-26 LAB — LIPID PANEL
HDL: 38 mg/dL — ABNORMAL LOW (ref >39)
Total CHOL/HDL Ratio: 5.3 Ratio
VLDL: 19 mg/dL (ref 0–40)

## 2011-12-26 LAB — COMPLETE METABOLIC PANEL WITH GFR
AST: 10 U/L (ref 0–37)
Albumin: 4.5 g/dL (ref 3.5–5.2)
Alkaline Phosphatase: 45 U/L (ref 39–117)
GFR, Est Non African American: >89 mL/min
Glucose, Bld: 93 mg/dL (ref 70–99)
Potassium: 4.6 mEq/L (ref 3.5–5.3)
Sodium: 138 mEq/L (ref 135–145)
Total Bilirubin: 0.6 mg/dL (ref 0.3–1.2)
Total Protein: 7 g/dL (ref 6.0–8.3)

## 2011-12-26 LAB — TSH: TSH: 2.284 u[IU]/mL (ref 0.350–4.500)

## 2011-12-26 NOTE — Progress Notes (Signed)
  Subjective:    Patient ID: Caitlyn Perez, female    DOB: [birth year], 36 y.o.   MRN: [id number] TB test HPI    Review of Systems     Objective:   Physical Exam        Assessment & Plan:

## 2012-07-13 ENCOUNTER — Telehealth: Payer: Self-pay | Admitting: *Deleted

## 2012-07-13 ENCOUNTER — Ambulatory Visit: Payer: BC Managed Care – PPO | Admitting: Family Medicine

## 2012-07-13 MED ORDER — ERYTHROMYCIN 5 MG/GM OP OINT: TOPICAL_OINTMENT | Freq: Four times a day (QID) | OPHTHALMIC | Status: DC

## 2012-07-13 NOTE — Telephone Encounter (Signed)
Pt.notified

## 2012-07-13 NOTE — Telephone Encounter (Signed)
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 Wants to know if you will give her something for it without coming in

## 2012-07-13 NOTE — Telephone Encounter (Signed)
Will send over eryth oph oint. It is generic. If any vision changes or pain needs OV.

## 2013-01-27 ENCOUNTER — Encounter: Payer: Self-pay | Admitting: Obstetrics & Gynecology

## 2013-01-27 ENCOUNTER — Ambulatory Visit (INDEPENDENT_AMBULATORY_CARE_PROVIDER_SITE_OTHER): Payer: BC Managed Care – PPO | Admitting: Obstetrics & Gynecology

## 2013-01-27 VITALS — BP 131/89 | HR 81 | Resp 16 | Ht 62.0 in | Wt 145.0 lb

## 2013-01-27 DIAGNOSIS — N912 Amenorrhea, unspecified: Secondary | ICD-10-CM

## 2013-01-27 DIAGNOSIS — Z01419 Encounter for gynecological examination (general) (routine) without abnormal findings: Secondary | ICD-10-CM

## 2013-01-27 DIAGNOSIS — Z124 Encounter for screening for malignant neoplasm of cervix: Secondary | ICD-10-CM

## 2013-01-27 DIAGNOSIS — N911 Secondary amenorrhea: Secondary | ICD-10-CM

## 2013-01-27 DIAGNOSIS — Z1151 Encounter for screening for human papillomavirus (HPV): Secondary | ICD-10-CM

## 2013-01-27 NOTE — Progress Notes (Signed)
  Subjective:     Caitlyn Perez is a 37 y.o. female here for a routine exam.  Current complaints: no menses since May, only spotting.  Pt was very regular her whole life.  Personal health questionnaire reviewed: yes.   Gynecologic History No LMP recorded. Contraception: tubal ligation Last Pap: 2011. Results were: normal Last mammogram: n/a.   Obstetric History OB History  Gravida Para Term Preterm AB SAB TAB Ectopic Multiple Living  2 2 2       2     # Outcome Date GA Lbr Len/2nd Weight Sex Delivery Anes PTL Lv  2 TRM 02/28/10          1 TRM 09/08/08               The following portions of the patient's history were reviewed and updated as appropriate: allergies, current medications, past family history, past medical history, past social history, past surgical history and problem list.  Review of Systems Pertinent items are noted in HPI.    Objective:   Filed Vitals:   01/27/13 1511 01/27/13 1516  BP:  131/89  Pulse:  81  Resp:  16  Height: 5\' 2"  (1.575 m) 5\' 2"  (1.575 m)  Weight:  145 lb (65.772 kg)      Vitals:  WNL General appearance: alert, cooperative and no distress Head: Normocephalic, without obvious abnormality, atraumatic Eyes: negative Throat: lips, mucosa, and tongue normal; teeth and gums normal Lungs: clear to auscultation bilaterally Breasts: normal appearance, no masses or tenderness, No nipple retraction or dimpling, No nipple discharge or bleeding Heart: regular rate and rhythm Abdomen: soft, non-tender; bowel sounds normal; no masses,  no organomegaly Pelvic: cervix normal in appearance, external genitalia normal, no adnexal masses or tenderness, no bladder tenderness, no cervical motion tenderness, perianal skin: no external genital warts noted, urethra without abnormality or discharge, uterus normal size, shape, and consistency and vagina normal without discharge Extremities: no edema, redness or tenderness in the calves or thighs Skin: no  lesions or rash, no hirsutism Lymph nodes: Axillary adenopathy: none        Assessment:    Healthy female exam.    Plan:    Education reviewed: self breast exams. Contraception: tubal ligation. Follow up in: prn weeks. Will check TSH, prolactin, FSH, estradiol, testosterone, 17 OHP, DHEAs Pap with HPV

## 2013-01-27 NOTE — Patient Instructions (Addendum)
Secondary Amenorrhea   Secondary amenorrhea is the stopping of menstrual flow for 3 to 6 months in a female who has previously had periods. There are many possible causes. Most of these causes are not serious. Usually treating the underlying problem causing the loss of menses will return your periods to normal.  CAUSES   Some common and uncommon causes of not menstruating include:  · Malnutrition.  · Low blood sugar (hypoglycemia).  · Polycystic ovarian disease.  · Stress or fear.  · Breastfeeding.  · Hormone imbalance.  · Ovarian failure.  · Medications.  · Extreme obesity.  · Cystic fibrosis.  · Low body weight or drastic weight reduction from any cause.  · Early menopause.  · Removal of ovaries or uterus.  · Contraceptives.  · Illness.  · Long term (chronic) illnesses.  · Cushing's syndrome.  · Thyroid problems.  · Birth control pills, patches, or vaginal rings for birth control.  DIAGNOSIS   This diagnosis is made by your caregiver taking a medical history and doing a physical exam. Pregnancy must be ruled out. Often times, numerous blood tests of different hormones in the body may be measured. Urine testing may be done. Specialized x-rays may have to be done as well as measuring the body mass index (BMI).  TREATMENT   Treatment depends on the cause of the amenorrhea. If an eating disorder is present, this can be treated with an adequate diet and therapy. Chronic illnesses may improve with treatment of the illness. Overall, the outlook is good. The amenorrhea may be corrected with medications, lifestyle changes, or surgery. If the amenorrhea cannot be corrected, it is sometimes possible to create a false menstruation with medications.  Document Released: 06/30/2006 Document Revised: 08/11/2011 Document Reviewed: 05/07/2007  Exit[employer]® Patient Information ©2014 [employer].

## 2013-01-27 NOTE — Addendum Note (Signed)
Addended by: Arne Cleveland on: 01/27/2013 03:47 PM   Modules accepted: Orders

## 2013-01-28 LAB — PROLACTIN: Prolactin: 5.2 ng/mL

## 2013-01-28 LAB — TESTOSTERONE: Testosterone: 43 ng/dL (ref 10–70)

## 2013-01-28 LAB — FOLLICLE STIMULATING HORMONE: FSH: 4.5 m[IU]/mL

## 2013-01-31 LAB — 17-HYDROXYPROGESTERONE: 17-OH-Progesterone, LC/MS/MS: 30 ng/dL

## 2013-02-08 LAB — ESTRADIOL, FREE: Estradiol, Free: 2.02 pg/mL

## 2013-02-09 ENCOUNTER — Telehealth: Payer: Self-pay | Admitting: *Deleted

## 2013-02-09 NOTE — Telephone Encounter (Signed)
Message copied by Arne Cleveland on Wed Feb 09, 2013  3:11 PM ------      Message from: Lesly Dukes      Created: Wed Feb 09, 2013  9:51 AM       All labs and pap are nml.  Pt should return as needed.  If no menses in 3 months, should return.  Please call if she has any questions. ------

## 2013-02-09 NOTE — Telephone Encounter (Signed)
Called pt to adv all labs and pap was normal - [facility]

## 2013-04-07 ENCOUNTER — Other Ambulatory Visit: Payer: Self-pay

## 2013-08-29 ENCOUNTER — Encounter: Payer: Self-pay | Admitting: Family Medicine

## 2013-08-29 ENCOUNTER — Ambulatory Visit (INDEPENDENT_AMBULATORY_CARE_PROVIDER_SITE_OTHER): Payer: BC Managed Care – PPO | Admitting: Family Medicine

## 2013-08-29 VITALS — BP 123/77 | HR 68 | Ht 62.0 in | Wt 148.0 lb

## 2013-08-29 DIAGNOSIS — Z0000 Encounter for general adult medical examination without abnormal findings: Secondary | ICD-10-CM

## 2013-08-29 DIAGNOSIS — N920 Excessive and frequent menstruation with regular cycle: Secondary | ICD-10-CM

## 2013-08-29 DIAGNOSIS — R635 Abnormal weight gain: Secondary | ICD-10-CM

## 2013-08-29 DIAGNOSIS — G4700 Insomnia, unspecified: Secondary | ICD-10-CM

## 2013-08-29 LAB — LIPID PANEL
CHOL/HDL RATIO: 5.2 ratio
Cholesterol: 220 mg/dL — ABNORMAL HIGH (ref 0–200)
HDL: 42 mg/dL (ref >39)
LDL CALC: 149 mg/dL — AB (ref 0–99)
Triglycerides: 143 mg/dL (ref <150)
VLDL: 29 mg/dL (ref 0–40)

## 2013-08-29 LAB — COMPLETE METABOLIC PANEL WITH GFR
ALK PHOS: 52 U/L (ref 39–117)
ALT: 8 U/L (ref 0–35)
AST: 10 U/L (ref 0–37)
Albumin: 4.2 g/dL (ref 3.5–5.2)
BILIRUBIN TOTAL: 0.5 mg/dL (ref 0.2–1.2)
BUN: 13 mg/dL (ref 6–23)
CO2: 28 mEq/L (ref 19–32)
CREATININE: 0.7 mg/dL (ref 0.50–1.10)
Calcium: 9 mg/dL (ref 8.4–10.5)
Chloride: 103 mEq/L (ref 96–112)
GFR, Est African American: >89 mL/min
GFR, Est Non African American: >89 mL/min
Glucose, Bld: 90 mg/dL (ref 70–99)
Potassium: 4 mEq/L (ref 3.5–5.3)
SODIUM: 139 meq/L (ref 135–145)
TOTAL PROTEIN: 6.8 g/dL (ref 6.0–8.3)

## 2013-08-29 LAB — TSH: TSH: 1.458 u[IU]/mL (ref 0.350–4.500)

## 2013-08-29 NOTE — Progress Notes (Signed)
Subjective:     Caitlyn Perez is a 38 y.o. female and is here for a comprehensive physical exam. The patient reports problems - Periods have been irregular. Spotted for almost 3 months and thne had a regular period for 2 weeks at [place]. since then has had heavy periods with large blood clots.  Chaing her tampon every hour..She has not been sleeping well.  Melatonin makes her heart race.  Wants to know what else to take.  Can fall alseep but having a hard time falling asleep.  Husband says she snores.  Not waking up feeling rested.  Has had more frequent HA than usual. Having a hard time losing weight.   History   Social History  . Marital Status: Married    Spouse Name: N/A    Number of Children: 2  . Years of Education: N/A   Occupational History  . teacher     [employer]   Social History Main Topics  . Smoking status: Never Smoker   . Smokeless tobacco: Never Used  . Alcohol Use: 0.6 oz/week    1 Cans of beer per week     Comment: occassional  . Drug Use: No  . Sexual Activity: Yes    Partners: Female    Patent examiner Protection: None   Other Topics Concern  . Not on file   Social History Narrative   Regular exercise, 3-5 times per week.     Health Maintenance  Topic Date Due  . Influenza Vaccine  08/31/2014 (Originally 12/31/2012)  . Pap Smear  01/28/2016  . Tetanus/tdap  12/24/2021    The following portions of the patient's history were reviewed and updated as appropriate: allergies, current medications, past family history, past medical history, past social history, past surgical history and problem list.  Review of Systems A comprehensive review of systems was negative.   Objective:    BP 123/77  Pulse 68  Ht 5\' 2"  (1.575 m)  Wt 148 lb (67.132 kg)  BMI 27.06 kg/m2  SpO2 99% General appearance: alert, cooperative and appears stated age Head: Normocephalic, without obvious abnormality, atraumatic Eyes: negative Ears: normal TM's and  external ear canals both ears Nose: Nares normal. Septum midline. Mucosa normal. No drainage or sinus tenderness. Throat: lips, mucosa, and tongue normal; teeth and gums normal Neck: no adenopathy, no carotid bruit, no JVD, supple, symmetrical, trachea midline and thyroid not enlarged, symmetric, no tenderness/mass/nodules Back: symmetric, no curvature. ROM normal. No CVA tenderness. Lungs: clear to auscultation bilaterally Heart: regular rate and rhythm, S1, S2 normal, no murmur, click, rub or gallop Abdomen: soft, non-tender; bowel sounds normal; no masses,  no organomegaly Extremities: extremities normal, atraumatic, no cyanosis or edema Pulses: 2+ and symmetric Skin: Skin color, texture, turgor normal. No rashes or lesions Lymph nodes: Cervical, supraclavicular, and axillary nodes normal. Neurologic: Alert and oriented X 3, normal strength and tone. Normal symmetric reflexes. Normal coordination and gait    Assessment:    Healthy female exam.      Plan:     See After Visit Summary for Counseling Recommendations  Keep up a regular exercise program and make sure you are eating a healthy diet Try to eat 4 servings of dairy a day, or if you are lactose intolerant take a calcium with vitamin D daily.  Your vaccines are up to date.   Insomnia - recommend a trial of Valerian root capsules. If this is not helping and consider prescription options. She really wants to  hold off of prescription medication at this time she can. We also reviewed some sleep hygiene measures.  Menorrhagia-at this point in time I can forward with an ultrasound. Because she is at age 59 then she may actually need an endometrial biopsy for further evaluation. We'll start with the ultrasound of her. She did have blood work done in the fall which was normal for female hormone levels.

## 2013-08-29 NOTE — Patient Instructions (Signed)
Recommend trial of Valerian root capsules for sleep. If not helping please let me known contrast something mild for sleep this prescription. We will schedule your ultrasound to give you a call. Keep up a regular exercise program and make sure you are eating a healthy diet Try to eat 4 servings of dairy a day, or if you are lactose intolerant take a calcium with vitamin D daily.  Your vaccines are up to date.

## 2013-08-30 ENCOUNTER — Ambulatory Visit (INDEPENDENT_AMBULATORY_CARE_PROVIDER_SITE_OTHER): Payer: BC Managed Care – PPO

## 2013-08-30 DIAGNOSIS — N920 Excessive and frequent menstruation with regular cycle: Secondary | ICD-10-CM

## 2013-08-30 LAB — CORTISOL: Cortisol, Plasma: 6.1 ug/dL

## 2014-04-03 ENCOUNTER — Encounter: Payer: Self-pay | Admitting: Family Medicine

## 2014-11-14 ENCOUNTER — Encounter: Payer: Self-pay | Admitting: Family Medicine

## 2014-11-14 ENCOUNTER — Ambulatory Visit (INDEPENDENT_AMBULATORY_CARE_PROVIDER_SITE_OTHER): Payer: BC Managed Care – PPO | Admitting: Family Medicine

## 2014-11-14 VITALS — BP 120/72 | HR 73 | Ht 62.0 in | Wt 148.0 lb

## 2014-11-14 DIAGNOSIS — N912 Amenorrhea, unspecified: Secondary | ICD-10-CM

## 2014-11-14 DIAGNOSIS — Z0000 Encounter for general adult medical examination without abnormal findings: Secondary | ICD-10-CM | POA: Diagnosis not present

## 2014-11-14 DIAGNOSIS — D239 Other benign neoplasm of skin, unspecified: Secondary | ICD-10-CM | POA: Diagnosis not present

## 2014-11-14 DIAGNOSIS — Z6827 Body mass index (BMI) 27.0-27.9, adult: Secondary | ICD-10-CM

## 2014-11-14 MED ORDER — PHENTERMINE HCL 37.5 MG PO CAPS: 37.5000 mg | ORAL_CAPSULE | ORAL | Status: DC

## 2014-11-14 NOTE — Progress Notes (Signed)
Subjective:     Caitlyn Perez is a 39 y.o. female and is here for a comprehensive physical exam. The patient reports no problems. Only had 2 small periods this year.  Says really more spotting.  She is hard time losing weight. She is eating very healthy and getting regular exercise. She has really cut out sugar and made some major lifestyle changes over the last year and hasn't lost any weight.  Wants to discuss weight loss meds. She would like to get down to 120-125 range.     History   Social History  . Marital Status: Married    Spouse Name: N/A  . Number of Children: 2  . Years of Education: N/A   Occupational History  . teacher     [employer]   Social History Main Topics  . Smoking status: Never Smoker   . Smokeless tobacco: Never Used  . Alcohol Use: 0.6 oz/week    1 Cans of beer per week     Comment: occassional  . Drug Use: No  . Sexual Activity:    Partners: Female    Patent examiner Protection: None   Other Topics Concern  . Not on file   Social History Narrative   Regular exercise, 3-5 times per week.     Health Maintenance  Topic Date Due  . INFLUENZA VACCINE  01/01/2015  . PAP SMEAR  01/28/2016  . TETANUS/TDAP  12/24/2021  . HIV Screening  Completed    The following portions of the patient's history were reviewed and updated as appropriate: allergies, current medications, past family history, past medical history, past social history, past surgical history and problem list.  Review of Systems A comprehensive review of systems was negative.   Objective:    BP 120/72 mmHg  Pulse 73  Wt 148 lb (67.132 kg)  SpO2 98% General appearance: alert, cooperative and appears stated age Head: Normocephalic, without obvious abnormality, atraumatic Eyes: conj clear, EOMi, PEERLA Ears: normal TM's and external ear canals both ears Nose: Nares normal. Septum midline. Mucosa normal. No drainage or sinus tenderness. Throat: lips, mucosa, and tongue  normal; teeth and gums normal Neck: no adenopathy, no carotid bruit, no JVD, supple, symmetrical, trachea midline and thyroid not enlarged, symmetric, no tenderness/mass/nodules Back: symmetric, no curvature. ROM normal. No CVA tenderness. Lungs: clear to auscultation bilaterally Breasts: normal appearance, no masses or tenderness Heart: regular rate and rhythm, S1, S2 normal, no murmur, click, rub or gallop Abdomen: soft, non-tender; bowel sounds normal; no masses,  no organomegaly Extremities: extremities normal, atraumatic, no cyanosis or edema Pulses: 2+ and symmetric Skin: Skin color, texture, turgor normal. No rashes or lesions Lymph nodes: Cervical, supraclavicular, and axillary nodes normal. Neurologic: Alert and oriented X 3, normal strength and tone. Normal symmetric reflexes. Normal coordination and gait    Assessment:    Healthy female exam.      Plan:     See After Visit Summary for Counseling Recommendations  Keep up a regular exercise program and make sure you are eating a healthy diet Try to eat 4 servings of dairy a day, or if you are lactose intolerant take a calcium with vitamin D daily.  Your vaccines are up to date.   BMI27/abnormal weight gain - discussed options including phentermine. Discussed potential side effects of the medication. She has not had any heart or cardiac problems and blood pressure is usually very well controlled. She would like to try this. She will need follow-up monthly  for blood pressure and weight checks. She commences any chest pain or palpitations on medication she will need to stop immediately. Make sure take first thing in the morning to avoid sleep disturbance.  Amenorrhea-we'll recheck hormonal blood work. She had this done about 2 years ago when she saw GYN and she was having some amenorrhea then. We'll call with results once available.  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

## 2014-11-14 NOTE — Patient Instructions (Signed)
Keep up a regular exercise program and make sure you are eating a healthy diet Try to eat 4 servings of dairy a day, or if you are lactose intolerant take a calcium with vitamin D daily.  Your vaccines are up to date.   

## 2014-11-16 LAB — COMPLETE METABOLIC PANEL WITH GFR
ALBUMIN: 4.2 g/dL (ref 3.5–5.2)
ALT: 12 U/L (ref 0–35)
AST: 13 U/L (ref 0–37)
Alkaline Phosphatase: 58 U/L (ref 39–117)
BUN: 10 mg/dL (ref 6–23)
CO2: 25 meq/L (ref 19–32)
CREATININE: 0.73 mg/dL (ref 0.50–1.10)
Calcium: 9 mg/dL (ref 8.4–10.5)
Chloride: 102 mEq/L (ref 96–112)
Glucose, Bld: 91 mg/dL (ref 70–99)
Potassium: 4.5 mEq/L (ref 3.5–5.3)
SODIUM: 138 meq/L (ref 135–145)
TOTAL PROTEIN: 6.8 g/dL (ref 6.0–8.3)
Total Bilirubin: 0.5 mg/dL (ref 0.2–1.2)

## 2014-11-16 LAB — LIPID PANEL
CHOL/HDL RATIO: 5 ratio
Cholesterol: 207 mg/dL — ABNORMAL HIGH (ref 0–200)
HDL: 41 mg/dL — AB (ref >=46)
LDL Cholesterol: 132 mg/dL — ABNORMAL HIGH (ref 0–99)
Triglycerides: 172 mg/dL — ABNORMAL HIGH (ref <150)
VLDL: 34 mg/dL (ref 0–40)

## 2014-11-16 LAB — TSH: TSH: 2.754 u[IU]/mL (ref 0.350–4.500)

## 2014-11-17 LAB — ESTRADIOL: ESTRADIOL: 189.1 pg/mL

## 2014-11-17 LAB — FOLLICLE STIMULATING HORMONE: FSH: 2.3 m[IU]/mL

## 2014-11-17 LAB — PROGESTERONE: Progesterone: 0.7 ng/mL

## 2014-11-17 LAB — LUTEINIZING HORMONE: LH: 1.6 m[IU]/mL

## 2014-12-14 ENCOUNTER — Ambulatory Visit (INDEPENDENT_AMBULATORY_CARE_PROVIDER_SITE_OTHER): Payer: BC Managed Care – PPO | Admitting: Family Medicine

## 2014-12-14 VITALS — BP 112/67 | HR 64 | Wt 144.0 lb

## 2014-12-14 DIAGNOSIS — R635 Abnormal weight gain: Secondary | ICD-10-CM | POA: Diagnosis not present

## 2014-12-14 MED ORDER — PHENTERMINE HCL 37.5 MG PO CAPS: 37.5000 mg | ORAL_CAPSULE | ORAL | Status: DC

## 2014-12-14 NOTE — Progress Notes (Signed)
   Subjective:    Patient ID: Caitlyn Perez, female    DOB: Apr 15, 1976, 39 y.o.   MRN: [id number]  HPI  CLARE FENNIMORE is here for blood pressure and weight check. Diet and exercise is going well. Denies trouble sleeping or palpitations.   Review of Systems     Objective:   Physical Exam        Assessment & Plan:  Abnormal weight gain - Patient has lost weight. A refill on phentermine will be faxed to the pharmacy.   Doing well. Down 4 lbs. F/U in 1 months.   Beatrice Lecher, MD

## 2015-01-05 ENCOUNTER — Ambulatory Visit (INDEPENDENT_AMBULATORY_CARE_PROVIDER_SITE_OTHER): Payer: BC Managed Care – PPO | Admitting: Family Medicine

## 2015-01-05 VITALS — BP 137/91 | HR 82 | Wt 141.0 lb

## 2015-01-05 DIAGNOSIS — R635 Abnormal weight gain: Secondary | ICD-10-CM | POA: Diagnosis not present

## 2015-01-05 DIAGNOSIS — Z6825 Body mass index (BMI) 25.0-25.9, adult: Secondary | ICD-10-CM | POA: Diagnosis not present

## 2015-01-05 MED ORDER — PHENTERMINE HCL 37.5 MG PO CAPS: 37.5000 mg | ORAL_CAPSULE | ORAL | Status: DC

## 2015-01-05 NOTE — Progress Notes (Signed)
   Subjective:    Patient ID: Caitlyn Perez, female    DOB: 1976/05/24, 39 y.o.   MRN: [id number]  HPI    Review of Systems     Objective:   Physical Exam        Assessment & Plan:  Abnormal weight gain-she's somewhat with the phentermine. She is down 3 pounds. Blood pressure is a little borderline slow need to monitor that carefully. Follow-up in one month for blood pressure and weight check.  Beatrice Lecher, MD

## 2015-01-05 NOTE — Progress Notes (Signed)
   Subjective:    Patient ID: Caitlyn Perez, female    DOB: [birth year], 39 y.o.   MRN: [id number]  HPI Patient is here for blood pressure and weight check. Denies any trouble sleeping, palpitations, or any other medication problems. States she has been making some diet and exercise changes to aid in her weight loss.   Review of Systems     Objective:   Physical Exam        Assessment & Plan:  Patient has lost weight. A refill for Phentermine will be sent to patient preferred pharmacy. Patient advised to schedule a four week nurse visit and keep her upcoming appointment with her PCP. Verbalized understanding, no further questions.

## 2015-02-02 ENCOUNTER — Ambulatory Visit (INDEPENDENT_AMBULATORY_CARE_PROVIDER_SITE_OTHER): Payer: BC Managed Care – PPO | Admitting: Family Medicine

## 2015-02-02 VITALS — BP 138/86 | HR 81 | Ht 62.0 in | Wt 139.0 lb

## 2015-02-02 DIAGNOSIS — R635 Abnormal weight gain: Secondary | ICD-10-CM

## 2015-02-02 MED ORDER — PHENTERMINE HCL 37.5 MG PO CAPS: 37.5000 mg | ORAL_CAPSULE | ORAL | Status: DC

## 2015-02-02 NOTE — Progress Notes (Signed)
   Subjective:    Patient ID: Caitlyn Perez, female    DOB: 1975/12/23, 39 y.o.   MRN: [id number]  HPI    Review of Systems     Objective:   Physical Exam        Assessment & Plan:  Abnormal weight gain-patient has lost 2 pounds. She's doing well. Continue with diet and exercise. Blood pressure is acceptable. We'll continue to follow. Follow up in one month.  Beatrice Lecher, MD

## 2015-02-02 NOTE — Progress Notes (Signed)
   Subjective:    Patient ID: Caitlyn Perez, female    DOB: 06-20-1975, 39 y.o.   MRN: [id number]  HPI  Caitlyn Perez is her for a weight and blood pressure check. Denies palpitations or trouble sleeping.   Review of Systems     Objective:   Physical Exam        Assessment & Plan:  Patient has lost weight. A refill will be faxed to [facility].

## 2015-02-12 ENCOUNTER — Telehealth: Payer: Self-pay | Admitting: Family Medicine

## 2015-02-12 NOTE — Telephone Encounter (Signed)
Received fax for prior authorization on Phentermine sent through cover my meds and received authorization. Case ID [id number] good from 01/13/2015 - 02/12/2016. - CF

## 2015-03-06 ENCOUNTER — Ambulatory Visit (INDEPENDENT_AMBULATORY_CARE_PROVIDER_SITE_OTHER): Payer: BC Managed Care – PPO | Admitting: Family Medicine

## 2015-03-06 ENCOUNTER — Encounter: Payer: Self-pay | Admitting: Family Medicine

## 2015-03-06 VITALS — BP 133/84 | HR 84 | Ht 62.0 in | Wt 137.0 lb

## 2015-03-06 DIAGNOSIS — R635 Abnormal weight gain: Secondary | ICD-10-CM | POA: Diagnosis not present

## 2015-03-06 MED ORDER — PHENTERMINE HCL 37.5 MG PO CAPS: 37.5000 mg | ORAL_CAPSULE | ORAL | Status: DC

## 2015-03-06 NOTE — Progress Notes (Signed)
   Subjective:    Patient ID: Caitlyn Perez, female    DOB: 1976/03/25, 39 y.o.   MRN: [id number]  HPI  Abnormal weight loss - she hs been doing well with the phenterrmine.  She would like to be 130.  She hasn't ben able to do as much walking but has ben tryin to do some weights.  She's really try to work on portion control and limiting carbohydrate intake. She has been trying to walk a lot on the weekends but finds her to do during the weeks that has been doing some light weight lifting. Ab workouts  and stretches. She denies any chest pain, palpitations, shortest of breath, or insomnia on the phentermine.  Review of Systems     Objective:   Physical Exam  Constitutional: She is oriented to person, place, and time. She appears well-developed and well-nourished.  HENT:  Head: Normocephalic and atraumatic.  Cardiovascular: Normal rate, regular rhythm and normal heart sounds.   Pulmonary/Chest: Effort normal and breath sounds normal.  Neurological: She is alert and oriented to person, place, and time.  Skin: Skin is warm and dry.  Psychiatric: She has a normal mood and affect. Her behavior is normal.          Assessment & Plan:  Abnormal weight gain /BMI 25 - she did well last weekend when she forgot her medication.  Blood pressures well controlled today. She's lost 2 more pounds. Follow-up in one month for nurse blood pressure and weight check. Medication refill today. Sound like she is really on track with diet and exercise routine better. Did encourage her to try continue to work on getting some more aerobic activity during the week.

## 2015-04-04 ENCOUNTER — Ambulatory Visit (INDEPENDENT_AMBULATORY_CARE_PROVIDER_SITE_OTHER): Payer: BC Managed Care – PPO | Admitting: Family Medicine

## 2015-04-04 VITALS — BP 133/90 | HR 84 | Wt 135.0 lb

## 2015-04-04 DIAGNOSIS — R635 Abnormal weight gain: Secondary | ICD-10-CM

## 2015-04-04 MED ORDER — PHENTERMINE HCL 37.5 MG PO CAPS: 37.5000 mg | ORAL_CAPSULE | ORAL | Status: DC

## 2015-04-04 NOTE — Progress Notes (Signed)
   Subjective:    Patient ID: Caitlyn Perez, female    DOB: 04/12/1976, 39 y.o.   MRN: [id number]  HPI  Lynsie is here for blood pressure and weight check. Denies any medication problems.   Review of Systems     Objective:   Physical Exam        Assessment & Plan:  Abnormal weight gain - Patient has lost weight. A refill will be sent to [facility].  She's lost 2 pounds and is doing fantastic. She's a symptom back on the medication. Medication refills sent to the pharmacy. Follow up in 2 months for repeat blood pressure and weight check.  Beatrice Lecher, MD

## 2015-05-07 ENCOUNTER — Ambulatory Visit (INDEPENDENT_AMBULATORY_CARE_PROVIDER_SITE_OTHER): Payer: BC Managed Care – PPO | Admitting: Family Medicine

## 2015-05-07 VITALS — BP 130/78 | HR 87 | Wt 132.0 lb

## 2015-05-07 DIAGNOSIS — R635 Abnormal weight gain: Secondary | ICD-10-CM | POA: Diagnosis not present

## 2015-05-07 MED ORDER — PHENTERMINE HCL 37.5 MG PO CAPS: 37.5000 mg | ORAL_CAPSULE | ORAL | Status: DC

## 2015-05-07 NOTE — Progress Notes (Signed)
   Subjective:    Patient ID: Caitlyn Perez, female    DOB: 1975/10/01, 39 y.o.   MRN: [id number]  HPI  Caitlyn Perez is here for blood pressure and weight check. Diet and exercise is going well. Denies trouble sleeping or palpitations.   Review of Systems     Objective:   Physical Exam        Assessment & Plan:  Abnormal weight gain - Patient has lost weight. A refill of phentermine will be faxed to [facility].  Lost 3 lbs.   Beatrice Lecher, MD

## 2015-06-06 ENCOUNTER — Ambulatory Visit (INDEPENDENT_AMBULATORY_CARE_PROVIDER_SITE_OTHER): Payer: BC Managed Care – PPO | Admitting: Family Medicine

## 2015-06-06 ENCOUNTER — Encounter: Payer: Self-pay | Admitting: Family Medicine

## 2015-06-06 VITALS — BP 132/73 | HR 85 | Wt 132.0 lb

## 2015-06-06 DIAGNOSIS — R635 Abnormal weight gain: Secondary | ICD-10-CM | POA: Diagnosis not present

## 2015-06-06 DIAGNOSIS — Z79899 Other long term (current) drug therapy: Secondary | ICD-10-CM | POA: Diagnosis not present

## 2015-06-06 MED ORDER — PHENTERMINE HCL 37.5 MG PO CAPS: 37.5000 mg | ORAL_CAPSULE | ORAL | Status: DC

## 2015-06-06 NOTE — Progress Notes (Signed)
   Subjective:    Patient ID: Caitlyn Perez, female    DOB: [birth year], 40 y.o.   MRN: [id number]  HPI Abnormal weight gain.  she was able to work out more this month>  She did well over the holidays>  Has actually had more normal periods for the last 2 months.  She is feeling great. No S.E. On the phentermine. No CP, palpitations, or insomnia.  Working out 3-4 days per week.     Review of Systems     Objective:   Physical Exam  Constitutional: She is oriented to person, place, and time. She appears well-developed and well-nourished.  HENT:  Head: Normocephalic and atraumatic.  Cardiovascular: Normal rate, regular rhythm and normal heart sounds.   Pulmonary/Chest: Effort normal and breath sounds normal.  Neurological: She is alert and oriented to person, place, and time.  Skin: Skin is warm and dry.  Psychiatric: She has a normal mood and affect. Her behavior is normal.          Assessment & Plan:  Abnormal weight gain - Goal weight is 128.  She wants to lose about 5 more lbs.  Will refill medication. F/U in 1 mo for nurse BP/Weight check. Plan will be to taper the medication when we get close to her goal. Contijue with exericse and diet.

## 2015-07-10 ENCOUNTER — Ambulatory Visit (INDEPENDENT_AMBULATORY_CARE_PROVIDER_SITE_OTHER): Payer: BC Managed Care – PPO | Admitting: Family Medicine

## 2015-07-10 ENCOUNTER — Telehealth: Payer: Self-pay

## 2015-07-10 VITALS — BP 120/85 | HR 92 | Resp 18 | Wt 128.0 lb

## 2015-07-10 DIAGNOSIS — R635 Abnormal weight gain: Secondary | ICD-10-CM

## 2015-07-10 MED ORDER — PHENTERMINE HCL 15 MG PO CAPS
15.0000 mg | ORAL_CAPSULE | ORAL | Status: DC
Start: 2015-07-10 — End: 2015-08-07

## 2015-07-10 NOTE — Progress Notes (Signed)
   Subjective:    Patient ID: Caitlyn Perez, female    DOB: 03-19-1976, 40 y.o.   MRN: [id number]  HPI  Pt here for BP and weight check.  No trouble sleeping, no palpitations, and no problems with medication.    Review of Systems     Objective:   Physical Exam        Assessment & Plan:  Pt has lost weight.  Pt advised to schedule a follow up appointment for 30 days.  Note to Dr. Madilyn Fireman regarding dosage of medication due to goal weight reached.  Will advise patient.

## 2015-07-10 NOTE — Telephone Encounter (Signed)
Notified patient that MD sent new script to taper off medication.

## 2015-07-10 NOTE — Progress Notes (Signed)
Looks great. Lets taper off at this point. New rx sent for 15mg  tab.

## 2015-07-10 NOTE — Telephone Encounter (Signed)
Will send new rx for 15mg  to taper down.

## 2015-08-07 ENCOUNTER — Ambulatory Visit (INDEPENDENT_AMBULATORY_CARE_PROVIDER_SITE_OTHER): Payer: BC Managed Care – PPO | Admitting: Family Medicine

## 2015-08-07 ENCOUNTER — Other Ambulatory Visit: Payer: Self-pay | Admitting: *Deleted

## 2015-08-07 VITALS — BP 127/81 | HR 81 | Ht 62.0 in | Wt 130.0 lb

## 2015-08-07 DIAGNOSIS — R635 Abnormal weight gain: Secondary | ICD-10-CM

## 2015-08-07 MED ORDER — PHENTERMINE HCL 15 MG PO CAPS: 15.0000 mg | ORAL_CAPSULE | ORAL | Status: DC

## 2015-08-07 NOTE — Progress Notes (Signed)
Subjective:     Patient ID: Caitlyn Perez, female   DOB: 02/11/1976, 40 y.o.   MRN: [id number]  HPI   Review of Systems     Objective:   Physical Exam     Assessment:         Plan:     She is doing very well. She really is down to a normal BMI. Think at this point we need to start spacing the phentermine and start weaning her off. Okay to refill 30 capsules. That I recommend that she start taking it every other day for a month and then every third day for a month.  Beatrice Lecher, MD

## 2015-08-07 NOTE — Progress Notes (Signed)
   Subjective:    Patient ID: Caitlyn Perez, female    DOB: 1975/08/21, 40 y.o.   MRN: [id number] Pt advised of new instructions for phentermine.  Beatris Ship, CMA HPI    Review of Systems     Objective:   Physical Exam        Assessment & Plan:

## 2015-08-07 NOTE — Progress Notes (Signed)
   Subjective:    Patient ID: Caitlyn Perez, female    DOB: May 02, 1976, 40 y.o.   MRN: [id number] Pt in today for weight/bp check.  She has went down to 15mg  of the phentermine daily.  She is up 2lbs and isn't too concerned but would still like to continue on the 15mg  for now.  I advised pt that I would call her with your recommendations as far as continuing the phentermine.  She wanted you to be aware that she is starting to miss periods again.  She stated that you guys had discussed this before.  She had one 3 months straight but hasn't had one since the end of January.    Beatris Ship, CMA HPI    Review of Systems     Objective:   Physical Exam        Assessment & Plan:

## 2015-09-30 ENCOUNTER — Emergency Department
Admission: EM | Admit: 2015-09-30 | Discharge: 2015-09-30 | Disposition: A | Discharge status: Home / Self Care | Payer: BC Managed Care – PPO | Source: Home / Self Care | Attending: Family Medicine | Admitting: Family Medicine

## 2015-09-30 ENCOUNTER — Encounter: Payer: Self-pay | Admitting: Emergency Medicine

## 2015-09-30 ENCOUNTER — Emergency Department (INDEPENDENT_AMBULATORY_CARE_PROVIDER_SITE_OTHER): Payer: BC Managed Care – PPO

## 2015-09-30 DIAGNOSIS — S92512A Displaced fracture of proximal phalanx of left lesser toe(s), initial encounter for closed fracture: Secondary | ICD-10-CM

## 2015-09-30 DIAGNOSIS — W51XXXA Accidental striking against or bumped into by another person, initial encounter: Secondary | ICD-10-CM | POA: Diagnosis not present

## 2015-09-30 DIAGNOSIS — S92912A Unspecified fracture of left toe(s), initial encounter for closed fracture: Secondary | ICD-10-CM | POA: Diagnosis not present

## 2015-09-30 NOTE — Discharge Instructions (Signed)
Buddy tape toes.  Apply ice pack for 10 to 15 minutes, 3 to 4 times daily  Continue until pain and swelling decrease.  Elevate.  May take Ibuprofen 200mg , 4 tabs every 8 hours with food.  Ensure adequate Vitamin D and calcium intake.     Toe Fracture A toe fracture is a break in one of the toe bones (phalanges). CAUSES This condition may be caused by:  Dropping a heavy object on your toe.  Stubbing your toe.  Overusing your toe or doing repetitive exercise.  Twisting or stretching your toe out of place. RISK FACTORS This condition is more likely to develop in people who:  Play contact sports.  Have a bone disease.  Have a low calcium level. SYMPTOMS The main symptoms of this condition are swelling and pain in the toe. The pain may get worse with standing or walking. Other symptoms include:  Bruising.  Stiffness.  Numbness.  A change in the way the toe looks.  Broken bones that poke through the skin.  Blood beneath the toenail. DIAGNOSIS This condition is diagnosed with a physical exam. You may also have X-rays. TREATMENT  Treatment for this condition depends on the type of fracture and its severity. Treatment may involve:  Taping the broken toe to a toe that is next to it (buddy taping). This is the most common treatment for fractures in which the bone has not moved out of place (nondisplaced fracture).  Wearing a shoe that has a wide, rigid sole to protect the toe and to limit its movement.  Wearing a walking cast.  Having a procedure to move the toe back into place.  Surgery. This may be needed:  If there are many pieces of broken bone that are out of place (displaced).  If the toe joint breaks.  If the bone breaks through the skin.  Physical therapy. This is done to help regain movement and strength in the toe. You may need follow-up X-rays to make sure that the bone is healing well and staying in position. HOME CARE INSTRUCTIONS If You Have a  Cast:  Do not stick anything inside the cast to scratch your skin. Doing that increases your risk of infection.  Check the skin around the cast every day. Report any concerns to your health care provider. You may put lotion on dry skin around the edges of the cast. Do not apply lotion to the skin underneath the cast.  Do not put pressure on any part of the cast until it is fully hardened. This may take several hours.  Keep the cast clean and dry. Bathing  Do not take baths, swim, or use a hot tub until your health care provider approves. Ask your health care provider if you can take showers. You may only be allowed to take sponge baths for bathing.  If your health care provider approves bathing and showering, cover the cast or bandage (dressing) with a watertight plastic bag to protect it from water. Do not let the cast or dressing get wet. Managing Pain, Stiffness, and Swelling  If you do not have a cast, apply ice to the injured area, if directed.  Put ice in a plastic bag.  Place a towel between your skin and the bag.  Leave the ice on for 20 minutes, 2-3 times per day.  Move your toes often to avoid stiffness and to lessen swelling.  Raise (elevate) the injured area above the level of your heart while you are sitting  or lying down. Driving  Do not drive or operate heavy machinery while taking pain medicine.  Do not drive while wearing a cast on a foot that you use for driving. Activity  Return to your normal activities as directed by your health care provider. Ask your health care provider what activities are safe for you.  Perform exercises daily as directed by your health care provider or physical therapist. Safety  Do not use the injured limb to support your body weight until your health care provider says that you can. Use crutches or other assistive devices as directed by your health care provider. General Instructions  If your toe was treated with buddy taping,  follow your health care provider's instructions for changing the gauze and tape. Change it more often:  The gauze and tape get wet. If this happens, dry the space between the toes.  The gauze and tape are too tight and cause your toe to become pale or numb.  Wear a protective shoe as directed by your health care provider. If you were not given a protective shoe, wear sturdy, supportive shoes. Your shoes should not pinch your toes and should not fit tightly against your toes.  Do not use any tobacco products, including cigarettes, chewing tobacco, or e-cigarettes. Tobacco can delay bone healing. If you need help quitting, ask your health care provider.  Take medicines only as directed by your health care provider.  Keep all follow-up visits as directed by your health care provider. This is important. SEEK MEDICAL CARE IF:  You have a fever.  Your pain medicine is not helping.  Your toe is cold.  Your toe is numb.  You still have pain after one week of rest and treatment.  You still have pain after your health care provider has said that you can start walking again.  You have pain, tingling, or numbness in your foot that is not going away. SEEK IMMEDIATE MEDICAL CARE IF:  You have severe pain.  You have redness or inflammation in your toe that is getting worse.  You have pain or numbness in your toe that is getting worse.  Your toe turns blue.   This information is not intended to replace advice given to you by your health care provider. Make sure you discuss any questions you have with your health care provider.   Document Released: 05/16/2000 Document Revised: 02/07/2015 Document Reviewed: 03/15/2014 Elsevier Interactive Patient Education [employer].

## 2015-09-30 NOTE — ED Provider Notes (Signed)
CSN: [id number]     Arrival date & time 09/30/15  1106 History   First MD Initiated Contact with Patient 09/30/15 1351     Chief Complaint  Patient presents with  . Toe Injury      HPI Comments: Patient collided with her daughter and injured her left fifth toe 2 hours ago.  Patient is a 40 y.o. female presenting with toe pain. The history is provided by the patient and the spouse.  Toe Pain This is a new problem. The current episode started 1 to 2 hours ago. The problem occurs constantly. The problem has not changed since onset.Associated symptoms comments: none. The symptoms are aggravated by walking. Nothing relieves the symptoms. Treatments tried: Ibuprofen. The treatment provided mild relief.    Past Medical History  Diagnosis Date  . Umbilical hernia [id number]  . Cesarean delivery delivered 2010, 2011   Past Surgical History  Procedure Laterality Date  . Cesarean section    . Umbical hernia repair  11/2010  . Tubal ligation  BTL at time of 2nd cesarean section   Family History  Problem Relation Age of Onset  . Lung cancer Maternal Grandmother 70    lung  . Diabetes Paternal Grandmother    Social History  Substance Use Topics  . Smoking status: Never Smoker   . Smokeless tobacco: Never Used  . Alcohol Use: 0.6 oz/week    1 Cans of beer per week     Comment: occassional   OB History    Gravida Para Term Preterm AB TAB SAB Ectopic Multiple Living   2 2 2       2      Review of Systems  All other systems reviewed and are negative.   Allergies  Review of patient's allergies indicates no known allergies.  Home Medications   Prior to Admission medications   Medication Sig Start Date End Date Taking? Authorizing Provider  phentermine 15 MG capsule Take 1 capsule (15 mg total) by mouth every morning. 08/07/15   Hali Marry, MD   Meds Ordered and Administered this Visit  Medications - No data to display  BP 109/75 mmHg  Pulse 68  Temp(Src) 98.1 F (36.7 C)  (Oral)  Resp 16  Ht 5\' 2"  (1.575 m)  Wt 127 lb (57.607 kg)  BMI 23.22 kg/m2  SpO2 99% No data found.   Physical Exam  Constitutional: She is oriented to person, place, and time. She appears well-developed and well-nourished. No distress.  HENT:  Head: Atraumatic.  Eyes: Pupils are equal, round, and reactive to light.  Musculoskeletal:       Left foot: There is tenderness, bony tenderness and swelling. There is normal range of motion, normal capillary refill, no crepitus, no deformity and no laceration.       Feet:  There is mild swelling and tenderness to palpation over the left fifth toe proximal phalanx.  Distal neurovascular function is intact.   Neurological: She is alert and oriented to person, place, and time.  Skin: Skin is warm and dry.  Nursing note and vitals reviewed.   ED Course  Procedures none  Imaging Review Dg Foot Complete Left  09/30/2015  CLINICAL DATA:  40 year old female with injury to the left foot this morning complaining of pain in the fifth toe. EXAM: LEFT FOOT - COMPLETE 3+ VIEW COMPARISON:  No priors. FINDINGS: Mildly comminuted mildly angulated (approximately 15 degrees laterally) fracture of the mid left fifth proximal phalanx. No other acute displaced fracture,  subluxation or dislocation. IMPRESSION: 1. Acute mildly comminuted and angulated fracture of the mid left fifth proximal phalanx. Electronically Signed   By: Vinnie Langton M.D.   On: 09/30/2015 13:02      MDM   1. Fracture, toe, left, closed, initial encounter     Buddy tape toes.  Apply ice pack for 10 to 15 minutes, 3 to 4 times daily  Continue until pain and swelling decrease.  Elevate.  May take Ibuprofen 200mg , 4 tabs every 8 hours with food.  Ensure adequate Vitamin D and calcium intake.   Followup with Dr. Aundria Mems or Dr. Lynne Leader ([facility]) in one to two days for follow-up and management.    Kandra Nicolas, MD 10/02/15 [phone]

## 2015-09-30 NOTE — ED Notes (Signed)
Patient collided with daughter and left small toe was injured 2 hours ago; at incorrect angle to foot; husband tried to re-align it without success. Took ibuprofen before coming to [facility].

## 2016-05-05 ENCOUNTER — Encounter: Payer: Self-pay | Admitting: Family Medicine

## 2016-05-05 ENCOUNTER — Ambulatory Visit (INDEPENDENT_AMBULATORY_CARE_PROVIDER_SITE_OTHER): Payer: BC Managed Care – PPO | Admitting: Family Medicine

## 2016-05-05 ENCOUNTER — Other Ambulatory Visit (HOSPITAL_COMMUNITY)
Admission: RE | Admit: 2016-05-05 | Discharge: 2016-05-05 | Disposition: A | Discharge status: Home / Self Care | Payer: BC Managed Care – PPO | Source: Ambulatory Visit | Attending: Family Medicine | Admitting: Family Medicine

## 2016-05-05 VITALS — BP 119/72 | HR 65 | Ht 62.0 in | Wt 137.0 lb

## 2016-05-05 DIAGNOSIS — Z1151 Encounter for screening for human papillomavirus (HPV): Secondary | ICD-10-CM | POA: Insufficient documentation

## 2016-05-05 DIAGNOSIS — Z0000 Encounter for general adult medical examination without abnormal findings: Secondary | ICD-10-CM | POA: Diagnosis not present

## 2016-05-05 DIAGNOSIS — Z124 Encounter for screening for malignant neoplasm of cervix: Secondary | ICD-10-CM

## 2016-05-05 DIAGNOSIS — Z01419 Encounter for gynecological examination (general) (routine) without abnormal findings: Secondary | ICD-10-CM | POA: Insufficient documentation

## 2016-05-05 LAB — CBC WITH DIFFERENTIAL/PLATELET
BASOS ABS: 0 {cells}/uL (ref 0–200)
BASOS PCT: 0 %
EOS ABS: 280 {cells}/uL (ref 15–500)
Eosinophils Relative: 4 %
HCT: 44.3 % (ref 35.0–45.0)
HEMOGLOBIN: 14.5 g/dL (ref 11.7–15.5)
LYMPHS ABS: 1960 {cells}/uL (ref 850–3900)
Lymphocytes Relative: 28 %
MCH: 31.5 pg (ref 27.0–33.0)
MCHC: 32.7 g/dL (ref 32.0–36.0)
MCV: 96.1 fL (ref 80.0–100.0)
MPV: 9 fL (ref 7.5–12.5)
Monocytes Absolute: 350 cells/uL (ref 200–950)
Monocytes Relative: 5 %
NEUTROS ABS: 4410 {cells}/uL (ref 1500–7800)
Neutrophils Relative %: 63 %
PLATELETS: 277 10*3/uL (ref 140–400)
RBC: 4.61 MIL/uL (ref 3.80–5.10)
RDW: 12.4 % (ref 11.0–15.0)
WBC: 7 10*3/uL (ref 3.8–10.8)

## 2016-05-05 LAB — COMPLETE METABOLIC PANEL WITH GFR
ALBUMIN: 4.3 g/dL (ref 3.6–5.1)
ALK PHOS: 50 U/L (ref 33–115)
ALT: 12 U/L (ref 6–29)
AST: 12 U/L (ref 10–30)
BILIRUBIN TOTAL: 0.6 mg/dL (ref 0.2–1.2)
BUN: 13 mg/dL (ref 7–25)
CALCIUM: 9 mg/dL (ref 8.6–10.2)
CO2: 28 mmol/L (ref 20–31)
CREATININE: 0.73 mg/dL (ref 0.50–1.10)
Chloride: 104 mmol/L (ref 98–110)
Glucose, Bld: 86 mg/dL (ref 65–99)
Potassium: 4.1 mmol/L (ref 3.5–5.3)
Sodium: 140 mmol/L (ref 135–146)
TOTAL PROTEIN: 6.6 g/dL (ref 6.1–8.1)

## 2016-05-05 LAB — LIPID PANEL
CHOLESTEROL: 216 mg/dL — AB (ref <200)
HDL: 42 mg/dL — AB (ref >50)
LDL Cholesterol: 140 mg/dL — ABNORMAL HIGH (ref <100)
TRIGLYCERIDES: 169 mg/dL — AB (ref <150)
Total CHOL/HDL Ratio: 5.1 Ratio — ABNORMAL HIGH (ref <5.0)
VLDL: 34 mg/dL — ABNORMAL HIGH (ref <30)

## 2016-05-05 LAB — TSH: TSH: 2.38 mIU/L

## 2016-05-05 NOTE — Progress Notes (Signed)
  Subjective:     Caitlyn Perez is a 40 y.o. female and is here for a comprehensive physical exam. The patient reports no problems.  Walking/Running for exercise.  She is still having irregular period.  Occ spotting. Has been going on for about 4 year.  Social History   Social History  . Marital status: Married    Spouse name: N/A  . Number of children: 2  . Years of education: N/A   Occupational History  . teacher     [employer]   Social History Main Topics  . Smoking status: Never Smoker  . Smokeless tobacco: Never Used  . Alcohol use 0.6 oz/week    1 Cans of beer per week     Comment: occassional  . Drug use: No  . Sexual activity: Yes    Partners: Female    Birth control/ protection: None   Other Topics Concern  . Not on file   Social History Narrative   Regular exercise, 3-5 times per week.     Health Maintenance  Topic Date Due  . INFLUENZA VACCINE  01/01/2016  . PAP SMEAR  01/28/2016  . TETANUS/TDAP  12/24/2021  . HIV Screening  Completed    The following portions of the patient's history were reviewed and updated as appropriate: allergies, current medications, past family history, past medical history, past social history, past surgical history and problem list.  Review of Systems A comprehensive review of systems was negative.   Objective:    BP 119/72   Pulse 65   Ht 5\' 2"  (1.575 m)   Wt 137 lb (62.1 kg)   SpO2 100%   BMI 25.06 kg/m  General appearance: alert, cooperative and appears stated age Head: Normocephalic, without obvious abnormality, atraumatic Eyes: conj claer, EOMI, PEERLA Ears: normal TM's and external ear canals both ears Nose: Nares normal. Septum midline. Mucosa normal. No drainage or sinus tenderness. Throat: lips, mucosa, and tongue normal; teeth and gums normal Neck: no adenopathy, no carotid bruit, no JVD, supple, symmetrical, trachea midline and thyroid not enlarged, symmetric, no  tenderness/mass/nodules Back: symmetric, no curvature. ROM normal. No CVA tenderness. Lungs: clear to auscultation bilaterally Breasts: normal appearance, no masses or tenderness Heart: regular rate and rhythm, S1, S2 normal, no murmur, click, rub or gallop Abdomen: soft, non-tender; bowel sounds normal; no masses,  no organomegaly Pelvic: cervix normal in appearance, external genitalia normal, no adnexal masses or tenderness, no cervical motion tenderness, rectovaginal septum normal, uterus normal size, shape, and consistency and vagina normal without discharge Extremities: extremities normal, atraumatic, no cyanosis or edema Pulses: 2+ and symmetric Skin: Skin color, texture, turgor normal. No rashes or lesions Lymph nodes: Cervical, supraclavicular, and axillary nodes normal. Neurologic: Alert and oriented X 3, normal strength and tone. Normal symmetric reflexes. Normal coordination and gait    Assessment:    Healthy female exam.      Plan:     See After Visit Summary for Counseling Recommendations   Keep up a regular exercise program and make sure you are eating a healthy diet Try to eat 4 servings of dairy a day, or if you are lactose intolerant take a calcium with vitamin D daily.  Your vaccines are up to date.  Pap performed. Will call with result once done.

## 2016-05-07 LAB — CYTOLOGY - PAP
DIAGNOSIS: NEGATIVE
HPV (WINDOPATH): NOT DETECTED

## 2016-07-30 ENCOUNTER — Ambulatory Visit (INDEPENDENT_AMBULATORY_CARE_PROVIDER_SITE_OTHER): Payer: BC Managed Care – PPO | Admitting: Family Medicine

## 2016-07-30 ENCOUNTER — Encounter: Payer: Self-pay | Admitting: Family Medicine

## 2016-07-30 VITALS — BP 114/69 | HR 66 | Ht 62.0 in | Wt 137.0 lb

## 2016-07-30 DIAGNOSIS — E280 Estrogen excess: Secondary | ICD-10-CM | POA: Diagnosis not present

## 2016-07-30 DIAGNOSIS — R4586 Emotional lability: Secondary | ICD-10-CM

## 2016-07-30 DIAGNOSIS — F39 Unspecified mood [affective] disorder: Secondary | ICD-10-CM | POA: Diagnosis not present

## 2016-07-30 DIAGNOSIS — N926 Irregular menstruation, unspecified: Secondary | ICD-10-CM | POA: Diagnosis not present

## 2016-07-30 DIAGNOSIS — F430 Acute stress reaction: Secondary | ICD-10-CM

## 2016-07-30 MED ORDER — BUPROPION HCL ER (XL) 150 MG PO TB24: 150.0000 mg | ORAL_TABLET | ORAL | 2 refills | Status: DC

## 2016-07-30 NOTE — Progress Notes (Signed)
   Subjective:    Patient ID: Caitlyn Perez, female    DOB: [birth year], 41 y.o.   MRN: [id number]  HPI 41 year old female comes in today because of mood changes in the last 3-4 weeks. She says she's felt just very emotional and tearful. She says it's really only at work. She says at home she feels like it's a good happy supportive environment and has not had any problems there. Sleep is fair that she's always had some problems with sleep issues. She said she actually left work early one day and then didn't go to work the next day because she just could not seem to get control of her emotions. She says is very unlike her. Normally controlling her motions is very important to her. 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 She does feel like there is a little bit attention and sometimes she rescinded things maybe a little more than she should. She's not been losing her job in fact she's actually been getting good evaluations. She feels very stressed at work. He does feel like she's been a little bit more down recently but denies feeling depressed or hopeless. She does admit that she has been a little bit more anxious than usual film little bit nervous and on edge several days a week and having some high levels of irritability more than half the days. She says she actually works out regularly and if anything she is actually been working out more recently than she was before. She feels like she eats very healthy as well.  Her periods are irregular. Last checked her thyroid in December and it looked great. She would like to have her cortisol levels checked.   Review of Systems     Objective:   Physical Exam  Constitutional: She is oriented to person, place, and time. She appears well-developed and well-nourished.  HENT:  Head: Normocephalic and atraumatic.  Cardiovascular: Normal rate,  regular rhythm and normal heart sounds.   Pulmonary/Chest: Effort normal and breath sounds normal.  Neurological: She is alert and oriented to person, place, and time.  Skin: Skin is warm and dry.  Psychiatric: She has a normal mood and affect. Her behavior is normal.          Assessment & Plan:  Acute stress-PHQ 9 score of 2 today which is essentially normal and dad 7 score of 5 today which is mild. Discussed options. I think should actually be a great candidate for therapy/counseling today with some of her stressors at work. She will think about this. Also discussed medication as an option. I do think she be a great candidate for Wellbutrin. Discussed risks benefits and potential side effects of the medication. We'll go ahead and check her hormone levels today since she has had irregular periods, though this is not new and per her request we will check cortisol levels as well. Continue to work on good sleep quality. Keep up the exercise and healthy diet.  Irregular period - recheck female hormones levels.    Time spent 25 min, > 35% spent counseling about mood and stress.

## 2016-07-31 LAB — LUTEINIZING HORMONE: LH: 7.1 m[IU]/mL

## 2016-07-31 LAB — CORTISOL: CORTISOL PLASMA: 9.7 ug/dL

## 2016-07-31 LAB — PROGESTERONE: Progesterone: 1.1 ng/mL

## 2016-07-31 LAB — FOLLICLE STIMULATING HORMONE: FSH: 6.8 m[IU]/mL

## 2016-07-31 LAB — ESTRADIOL: ESTRADIOL: 697 pg/mL — AB

## 2016-08-01 NOTE — Addendum Note (Signed)
Addended by: Beatrice Lecher D on: 08/01/2016 09:44 AM   Modules accepted: Orders

## 2016-08-06 LAB — AFP TUMOR MARKER: AFP TUMOR MARKER: 1.4 ng/mL (ref <6.1)

## 2016-08-06 LAB — LACTATE DEHYDROGENASE: LDH: 123 U/L (ref 100–200)

## 2016-08-06 LAB — ESTRADIOL: ESTRADIOL: 368 pg/mL — AB

## 2016-08-06 LAB — TESTOSTERONE: TESTOSTERONE: 36 ng/dL

## 2016-08-06 LAB — HCG, QUANTITATIVE, PREGNANCY

## 2016-08-08 LAB — ANTI MULLERIAN HORMONE: AMH ASSESSR: 0.36 ng/mL

## 2016-08-09 LAB — DHEA: DHEA: 417 ng/dL (ref 102–1185)

## 2016-08-09 LAB — ANDROSTENEDIONE: Androstenedione: 112 ng/dL

## 2016-08-11 NOTE — Addendum Note (Signed)
Addended by: Teddy Spike on: 08/11/2016 10:14 AM   Modules accepted: Orders

## 2016-08-12 ENCOUNTER — Telehealth: Payer: Self-pay | Admitting: Family Medicine

## 2016-08-12 DIAGNOSIS — E280 Estrogen excess: Secondary | ICD-10-CM

## 2016-08-12 NOTE — Telephone Encounter (Signed)
Please call patient: Endocrinologist actually reviewed her information and felt that some of her elevation and estrogen levels could actually be related to ovarian cyst or possibly even endometriosis. They actually recommend referral to GYN instead of endocrinology. Will place new referral. Please ask her if she has seen someone in the past for GYN care. If not see if she would want to go to our GYN here at the med center in [place].  Beatrice Lecher, MD

## 2016-08-12 NOTE — Telephone Encounter (Signed)
Spoke with Pt, she is OK to see GYN in our building. Referral order signed.

## 2016-08-20 ENCOUNTER — Encounter: Payer: Self-pay | Admitting: Family Medicine

## 2016-08-20 ENCOUNTER — Ambulatory Visit (INDEPENDENT_AMBULATORY_CARE_PROVIDER_SITE_OTHER): Payer: BC Managed Care – PPO | Admitting: Family Medicine

## 2016-08-20 VITALS — BP 106/64 | HR 69 | Ht 62.0 in | Wt 135.0 lb

## 2016-08-20 DIAGNOSIS — F430 Acute stress reaction: Secondary | ICD-10-CM | POA: Diagnosis not present

## 2016-08-20 DIAGNOSIS — E280 Estrogen excess: Secondary | ICD-10-CM

## 2016-08-20 MED ORDER — BUPROPION HCL ER (XL) 300 MG PO TB24: 300.0000 mg | ORAL_TABLET | ORAL | 2 refills | Status: DC

## 2016-08-20 NOTE — Progress Notes (Signed)
   Subjective:    Patient ID: Caitlyn Perez, female    DOB: [birth year], 41 y.o.   MRN: [id number]  HPI F/U acute stress reaction-I last saw her we decided to start Wellbutrin 150 mg daily in addition to doing some blood work. Her estrogen levels came back significantly elevated. Safar she's been tolerating Wellbutrin well. She did have some nausea for about 3 or 4 days but says it finally subsided. Says not having as many crying spells but still feeling anxious at times. She did have a meeting with her bowels on Monday they did help may be improved some issues there..    Elevated estrogen levels-weighted originally sent her referral to endocrinology but they felt like this would better to be handled by OB/GYN says she actually has an appointment next week. She does have irregular periods she denies any other endogenous use of any type of hormones etc. She said she barely even takes a multivitamin.  Review of Systems     Objective:   Physical Exam  Constitutional: She is oriented to person, place, and time. She appears well-developed and well-nourished.  HENT:  Head: Normocephalic and atraumatic.  Cardiovascular: Normal rate, regular rhythm and normal heart sounds.   Pulmonary/Chest: Effort normal and breath sounds normal.  Neurological: She is alert and oriented to person, place, and time.  Skin: Skin is warm and dry.  Psychiatric: She has a normal mood and affect. Her behavior is normal.          Assessment & Plan:  Acute stress reaction-overall she is doing better and has noticed some improvement with Wellbutrin. We discussed options. For now we'll increase this to 3 mg and I'll see her back in about 4 weeks. PHQ 9 is negative. She did mark one point for low energy. GAD 7 score of 3 today and rates symptoms as not difficult.  Elevated estrogen levels-weighted originally sent her referral to endocrinology but they felt like this would better to be handled by OB/GYN says she  actually has an appointment next week. She does have irregular periods

## 2016-08-27 ENCOUNTER — Encounter: Payer: Self-pay | Admitting: Obstetrics & Gynecology

## 2016-08-27 ENCOUNTER — Ambulatory Visit (INDEPENDENT_AMBULATORY_CARE_PROVIDER_SITE_OTHER): Payer: BC Managed Care – PPO | Admitting: Obstetrics & Gynecology

## 2016-08-27 VITALS — BP 136/93 | HR 75 | Ht 62.0 in | Wt 134.0 lb

## 2016-08-27 DIAGNOSIS — N911 Secondary amenorrhea: Secondary | ICD-10-CM

## 2016-08-28 MED ORDER — MEDROXYPROGESTERONE ACETATE 10 MG PO TABS: 10.0000 mg | ORAL_TABLET | Freq: Every day | ORAL | 0 refills | Status: DC

## 2016-08-29 NOTE — Progress Notes (Signed)
   Subjective:    Patient ID: Caitlyn Perez, female    DOB: Jan 02, 1976, 41 y.o.   MRN: [id number]  HPI  41 yo female presents from PCP with amenorrhea and elevated estradiol levels.  Pt had work up with me in 2014 for missed periods.  Labs were nml and she was told to f/u in 3 months if no menses.  Pt began being followed for women's health concerns by her PCP.  Pt began having some irregular menses, at times heavy with clots.  So laboratory work was sent in 2015 and 2106 (estradiol, FSH, THS, prolactin, LH, progesterone, etc. See epic labs tab)  Pt was confirmed to not be in menopause.  The last set of labs in March 2018 were sent due extreme emotional lability (crying at work --not like herself).  She also had not had a period since January 2018. She started on Wellbutrin and emotional state is much better.   Review of Systems  Constitutional: Negative for fatigue and unexpected weight change.  Respiratory: Negative.   Cardiovascular: Negative.   Gastrointestinal: Negative.   Genitourinary: Negative for pelvic pain, vaginal bleeding and vaginal discharge.  Skin: Negative.   Psychiatric/Behavioral: Positive for dysphoric mood.   Denies nipple discharge, stretch marks, headache, diplopia, female pattern hair growth.    Objective:   Physical Exam  Constitutional: She is oriented to person, place, and time. She appears well-developed and well-nourished. No distress.  HENT:  Head: Normocephalic and atraumatic.  Eyes: Conjunctivae are normal.  Pulmonary/Chest: Effort normal.  Abdominal: Soft. Bowel sounds are normal.  Musculoskeletal: She exhibits no edema.  Neurological: She is alert and oriented to person, place, and time.  Skin: Skin is warm and dry.  No hirsutism.  Psychiatric: She has a normal mood and affect.  Vitals reviewed.  Vitals:   08/27/16 1559  BP: (!) 136/93  Pulse: 75  Weight: 134 lb (60.8 kg)  Height: 5\' 2"  (1.575 m)          Assessment & Plan:  41 yo  female with amenorrhea and not in menopause 1-17 OHP level in a.m. 2-TUVS 3-Provera withdrawal bleed 4-Consider endometrial biopsy 5-Will review case with Dr. Kerin Perna  25 minutes spent face to face with >50% counseling.

## 2016-09-01 NOTE — Addendum Note (Signed)
Addended by: Lyndal Rainbow on: 09/01/2016 08:54 AM   Modules accepted: Orders

## 2016-09-01 NOTE — Addendum Note (Signed)
Addended by: Lyndal Rainbow on: 09/01/2016 08:46 AM   Modules accepted: Orders

## 2016-09-05 LAB — 17-HYDROXYPROGESTERONE: 17-OH-Progesterone, LC/MS/MS: 24 ng/dL

## 2016-09-12 ENCOUNTER — Ambulatory Visit (HOSPITAL_COMMUNITY)
Admission: RE | Admit: 2016-09-12 | Discharge: 2016-09-12 | Disposition: A | Discharge status: Home / Self Care | Payer: BC Managed Care – PPO | Source: Ambulatory Visit | Attending: Obstetrics & Gynecology | Admitting: Obstetrics & Gynecology

## 2016-09-12 DIAGNOSIS — N911 Secondary amenorrhea: Secondary | ICD-10-CM | POA: Insufficient documentation

## 2016-09-15 ENCOUNTER — Encounter: Payer: Self-pay | Admitting: Obstetrics & Gynecology

## 2016-09-15 ENCOUNTER — Other Ambulatory Visit: Payer: Self-pay | Admitting: Obstetrics & Gynecology

## 2016-09-15 MED ORDER — NORETHIN ACE-ETH ESTRAD-FE 1-20 MG-MCG(24) PO TABS: 1.0000 | ORAL_TABLET | Freq: Every day | ORAL | 11 refills | Status: DC

## 2016-09-15 NOTE — Progress Notes (Signed)
Perimenopause with irregular bleeding.  Persistent follicle causing increased eestrogen.  Pt offered Mirena IUD or continuous OCPs.  She opts for OCPs.  RTC 2 months--needs appt.

## 2016-09-18 ENCOUNTER — Encounter: Payer: Self-pay | Admitting: Obstetrics & Gynecology

## 2016-09-22 ENCOUNTER — Ambulatory Visit (INDEPENDENT_AMBULATORY_CARE_PROVIDER_SITE_OTHER): Payer: BC Managed Care – PPO | Admitting: Family Medicine

## 2016-09-22 ENCOUNTER — Encounter: Payer: Self-pay | Admitting: Family Medicine

## 2016-09-22 VITALS — BP 118/75 | HR 69 | Ht 62.0 in | Wt 135.0 lb

## 2016-09-22 DIAGNOSIS — N951 Menopausal and female climacteric states: Secondary | ICD-10-CM | POA: Diagnosis not present

## 2016-09-22 DIAGNOSIS — E280 Estrogen excess: Secondary | ICD-10-CM

## 2016-09-22 DIAGNOSIS — F430 Acute stress reaction: Secondary | ICD-10-CM | POA: Diagnosis not present

## 2016-09-22 NOTE — Progress Notes (Signed)
   Subjective:    Patient ID: Caitlyn Perez, female    DOB: [birth year], 41 y.o.   MRN: [id number]  HPI  Here for f/u of acute stress reaction - Reports that she is doing well on Wellbutrin 300 mg. No side effects or concerns. She really feels like emotionally things have leveled off for her. Work has also been a little bit better.she is working on her resume for work.  She is keeping the door open for new job/ or possibility.   Estrogen excess - she did see OB/GYN and they did a pelvic ultrasound and some additional blood work. Everything came back reassuring. They recommended that she start birth control because they felt like she was likely perimenopausal.  Review of Systems     BP 118/75   Pulse 69   Ht 5\' 2"  (1.575 m)   Wt 135 lb (61.2 kg)   LMP  (LMP Unknown)   SpO2 99%   BMI 24.69 kg/m     No Known Allergies    Objective:   Physical Exam  Constitutional: She is oriented to person, place, and time. She appears well-developed and well-nourished.  HENT:  Head: Normocephalic and atraumatic.  Cardiovascular: Normal rate, regular rhythm and normal heart sounds.   Pulmonary/Chest: Effort normal and breath sounds normal.  Neurological: She is alert and oriented to person, place, and time.  Skin: Skin is warm and dry.  Psychiatric: She has a normal mood and affect. Her behavior is normal.        Assessment & Plan:  Acute stress reaction-doing very well on Wellbutrin 3 mg daily. Continue current regimen. Follow-up in 6 months. We can discuss discontinuing medication at that time if she is doing well.  Estrogen excess-due to recheck estradiol about a month when she's been on the birth control pill for a month.

## 2016-11-17 ENCOUNTER — Other Ambulatory Visit: Payer: Self-pay | Admitting: Family Medicine

## 2016-11-29 LAB — ESTRADIOL: Estradiol: 18 pg/mL

## 2017-02-13 ENCOUNTER — Other Ambulatory Visit: Payer: Self-pay | Admitting: Family Medicine

## 2017-03-04 ENCOUNTER — Encounter: Payer: Self-pay | Admitting: *Deleted

## 2017-03-04 ENCOUNTER — Emergency Department
Admission: EM | Admit: 2017-03-04 | Discharge: 2017-03-04 | Disposition: A | Discharge status: Home / Self Care | Payer: BC Managed Care – PPO | Source: Home / Self Care | Attending: Emergency Medicine | Admitting: Emergency Medicine

## 2017-03-04 DIAGNOSIS — H6983 Other specified disorders of Eustachian tube, bilateral: Secondary | ICD-10-CM

## 2017-03-04 DIAGNOSIS — H66012 Acute suppurative otitis media with spontaneous rupture of ear drum, left ear: Secondary | ICD-10-CM

## 2017-03-04 DIAGNOSIS — H66011 Acute suppurative otitis media with spontaneous rupture of ear drum, right ear: Secondary | ICD-10-CM

## 2017-03-04 MED ORDER — AMOXICILLIN 875 MG PO TABS: ORAL_TABLET | ORAL | 0 refills | Status: DC

## 2017-03-04 MED ORDER — FLUTICASONE PROPIONATE 50 MCG/ACT NA SUSP: NASAL | 0 refills | Status: DC

## 2017-03-04 NOTE — ED Provider Notes (Signed)
[facility]    CSN: [id number] Arrival date & time: 03/04/17  1802     History   Chief Complaint Chief Complaint  Patient presents with  . Otalgia    HPI Caitlyn Perez is a 41 y.o. female.   HPI Pt c/o moderate, sharp, nonradiating RT ear pain and feeling of fluid in both ears x 2 days. She reports wearing ear plugs to bed Monday night and pulled them out of her ears fast Tuesday AM and felt pain in her RT ear immediately.   Past Medical History:  Diagnosis Date  . Cesarean delivery delivered 2010, 2011  . Umbilical hernia 4-62-70    Patient Active Problem List   Diagnosis Date Noted  . Perimenopausal 09/22/2016  . Secondary amenorrhea 01/27/2013  . HYPERCHOLESTEROLEMIA, MILD 11/16/2007    Past Surgical History:  Procedure Laterality Date  . CESAREAN SECTION    . TUBAL LIGATION  BTL at time of 2nd cesarean section  . umbical hernia repair  11/2010    OB History    Gravida Para Term Preterm AB Living   2 2 2     2    SAB TAB Ectopic Multiple Live Births                   Home Medications    Prior to Admission medications   Medication Sig Start Date End Date Taking? Authorizing Provider  amoxicillin (AMOXIL) 875 MG tablet Take 1 twice a day X 10 days. 03/04/17   Jacqulyn Cane, MD  buPROPion (WELLBUTRIN XL) 300 MG 24 hr tablet TAKE 1 TABLET (300 MG TOTAL) BY MOUTH EVERY MORNING. 02/13/17 02/13/18  Hali Marry, MD  fluticasone Asencion Islam) 50 MCG/ACT nasal spray 1 or 2 sprays each nostril twice a day 03/04/17   Jacqulyn Cane, MD  Norethindrone Acetate-Ethinyl Estrad-FE (LOESTRIN 24 FE) 1-20 MG-MCG(24) tablet Take 1 tablet by mouth daily. 09/15/16   Guss Bunde, MD    Family History Family History  Problem Relation Age of Onset  . Lung cancer Maternal Grandmother 70       lung  . Diabetes Paternal Grandmother     Social History Social History  Substance Use Topics  . Smoking status: Never Smoker  . Smokeless tobacco: Never Used    . Alcohol use 0.6 oz/week    1 Cans of beer per week     Comment: occassional     Allergies   Patient has no known allergies.   Review of Systems Review of Systems  All other systems reviewed and are negative.    Physical Exam Triage Vital Signs ED Triage Vitals [03/04/17 1825]  Enc Vitals Group     BP (!) 159/84     Pulse Rate 84     Resp 16     Temp 98.3 F (36.8 C)     Temp Source Oral     SpO2 99 %     Weight      Height      Head Circumference      Peak Flow      Pain Score      Pain Loc      Pain Edu?      Excl. in [place]?    No data found.   Updated Vital Signs BP (!) 159/84 (BP Location: Left Arm)   Pulse 84   Temp 98.3 F (36.8 C) (Oral)   Resp 16   Ht 5\' 2"  (1.575 m)   Wt  133 lb (60.3 kg)   LMP 12/02/2016   SpO2 99%   BMI 24.33 kg/m   Visual Acuity Right Eye Distance:   Left Eye Distance:   Bilateral Distance:    Right Eye Near:   Left Eye Near:    Bilateral Near:     Physical Exam  Constitutional: She is oriented to person, place, and time. She appears well-developed and well-nourished. No distress.  HENT:  Head: Normocephalic and atraumatic.  Right Ear: External ear and ear canal normal. No drainage. Tympanic membrane is injected, perforated (Small perforation), erythematous and retracted.  Left Ear: Tympanic membrane, external ear and ear canal normal.  Nose: Mucosal edema and rhinorrhea present.  Mouth/Throat: Oropharynx is clear and moist. No oral lesions.  Eyes: Conjunctivae are normal. No scleral icterus.  Neck: Neck supple.  Cardiovascular: Normal rate, regular rhythm and normal heart sounds.   Pulmonary/Chest: Effort normal and breath sounds normal.  Lymphadenopathy:    She has no cervical adenopathy.  Neurological: She is alert and oriented to person, place, and time.  Skin: Skin is warm and dry. No rash noted.  Nursing note and vitals reviewed.    UC Treatments / Results  Labs (all labs ordered are listed, but only  abnormal results are displayed) Labs Reviewed - No data to display  EKG  EKG Interpretation None       Radiology No results found.  Procedures Procedures (including critical care time)  Medications Ordered in UC Medications - No data to display   Initial Impression / Assessment and Plan / UC Course  I have reviewed the triage vital signs and the nursing notes.  Pertinent labs & imaging results that were available during my care of the patient were reviewed by me and considered in my medical decision making (see chart for details).    BP rechecked me 128/80   Final Clinical Impressions(s) / UC Diagnoses   Final diagnoses:  Eustachian tube dysfunction, bilateral  Acute suppurative otitis media of right ear with spontaneous rupture of tympanic membrane, recurrence not specified   Treatment options discussed, as well as risks, benefits, alternatives. Patient voiced understanding and agreement with the following plans:  New Prescriptions New Prescriptions   AMOXICILLIN (AMOXIL) 875 MG TABLET    Take 1 twice a day X 10 days.   FLUTICASONE (FLONASE) 50 MCG/ACT NASAL SPRAY    1 or 2 sprays each nostril twice a day   Precautions discussed. Avoid water in right ear for now. She declined prescription pain med. Use Tylenol or ibuprofen when necessary pain. Urged her to follow up with ENT within 2-3 days Red flags discussed. Go to ER if any red flag. She voiced understanding and agreement with above plans    Jacqulyn Cane, MD 03/08/17 1433

## 2017-03-04 NOTE — ED Triage Notes (Signed)
Pt c/o RT ear pain and feeling of fluid in LT ear x 1 day. She reports wearing ear plugs to bed Monday night and pulled them out of her ears fast Tuesday AM and felt pain in her RT ear immediately.

## 2017-03-22 ENCOUNTER — Other Ambulatory Visit: Payer: Self-pay | Admitting: Family Medicine

## 2017-04-30 ENCOUNTER — Ambulatory Visit: Payer: BC Managed Care – PPO | Admitting: Family Medicine

## 2017-04-30 ENCOUNTER — Encounter: Payer: Self-pay | Admitting: Family Medicine

## 2017-04-30 VITALS — BP 121/81 | HR 91 | Ht 62.0 in | Wt 131.0 lb

## 2017-04-30 DIAGNOSIS — N926 Irregular menstruation, unspecified: Secondary | ICD-10-CM | POA: Diagnosis not present

## 2017-04-30 DIAGNOSIS — F430 Acute stress reaction: Secondary | ICD-10-CM | POA: Diagnosis not present

## 2017-04-30 DIAGNOSIS — Z23 Encounter for immunization: Secondary | ICD-10-CM | POA: Diagnosis not present

## 2017-04-30 MED ORDER — BUPROPION HCL ER (XL) 300 MG PO TB24: 300.0000 mg | ORAL_TABLET | ORAL | 1 refills | Status: DC

## 2017-04-30 NOTE — Progress Notes (Signed)
   Subjective:    Patient ID: Caitlyn Perez, female    DOB: Jun 19, 1975, 41 y.o.   MRN: [id number]  HPI 6 month follow-up for medication for mood.  Previously seen for acute stress reaction.  She was having some increased stressors at work and we had decided to put her on Wellbutrin.  Actually she is been doing great on it since then.  No side effects or concerns.  She feels like emotionally things have leveled off.  She says is not ready to come off the medication.  She like to keep taking it at least through the winter months.  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  So not getting regular periods but is taking her birth control consistently. Wants to know if this is OK.    Review of Systems     Objective:   Physical Exam  Constitutional: She is oriented to person, place, and time. She appears well-developed and well-nourished.  HENT:  Head: Normocephalic and atraumatic.  Cardiovascular: Normal rate, regular rhythm and normal heart sounds.  Pulmonary/Chest: Effort normal and breath sounds normal.  Neurological: She is alert and oriented to person, place, and time.  Skin: Skin is warm and dry.  Psychiatric: She has a normal mood and affect. Her behavior is normal.        Assessment & Plan:  Follow-up stress/mood-with Wellbutrin for now.  She will call me in the spring if she is ready to taper otherwise I will see her back in 6 months if she still continuing the medication at that time.  Amenorrhea-taking her birth control consistently.  I think is fine if she still not getting consistent regular periods even on the birth control

## 2017-08-06 ENCOUNTER — Other Ambulatory Visit: Payer: Self-pay | Admitting: Obstetrics & Gynecology

## 2017-08-13 ENCOUNTER — Encounter: Payer: Self-pay | Admitting: Obstetrics & Gynecology

## 2017-08-14 ENCOUNTER — Other Ambulatory Visit: Payer: Self-pay | Admitting: *Deleted

## 2017-08-14 MED ORDER — NORETHIN ACE-ETH ESTRAD-FE 1-20 MG-MCG(24) PO TABS: 1.0000 | ORAL_TABLET | Freq: Every day | ORAL | 1 refills | Status: DC

## 2017-09-04 ENCOUNTER — Other Ambulatory Visit: Payer: Self-pay | Admitting: Obstetrics & Gynecology

## 2017-10-08 ENCOUNTER — Other Ambulatory Visit: Payer: Self-pay | Admitting: Family Medicine

## 2017-10-08 ENCOUNTER — Encounter: Payer: Self-pay | Admitting: Family Medicine

## 2017-10-08 ENCOUNTER — Ambulatory Visit (INDEPENDENT_AMBULATORY_CARE_PROVIDER_SITE_OTHER): Payer: BC Managed Care – PPO | Admitting: Family Medicine

## 2017-10-08 VITALS — BP 130/78 | HR 77 | Ht 62.0 in | Wt 130.0 lb

## 2017-10-08 DIAGNOSIS — Z0000 Encounter for general adult medical examination without abnormal findings: Secondary | ICD-10-CM | POA: Diagnosis not present

## 2017-10-08 DIAGNOSIS — Z1231 Encounter for screening mammogram for malignant neoplasm of breast: Secondary | ICD-10-CM

## 2017-10-08 MED ORDER — BUPROPION HCL ER (XL) 300 MG PO TB24: 300.0000 mg | ORAL_TABLET | ORAL | 3 refills | Status: DC

## 2017-10-08 NOTE — Patient Instructions (Signed)

## 2017-10-08 NOTE — Progress Notes (Signed)
Subjective:     Caitlyn Perez is a 42 y.o. female and is here for a comprehensive physical exam. The patient reports no problems.  Social History   Socioeconomic History  . Marital status: Married    Spouse name: Not on file  . Number of children: 2  . Years of education: Not on file  . Highest education level: Not on file  Occupational History  . Occupation: Pharmacist, hospital    Comment: [employer]  . Financial resource strain: Not on file  . Food insecurity:    Worry: Not on file    Inability: Not on file  . Transportation needs:    Medical: Not on file    Non-medical: Not on file  Tobacco Use  . Smoking status: Never Smoker  . Smokeless tobacco: Never Used  Substance and Sexual Activity  . Alcohol use: Yes    Alcohol/week: 0.6 oz    Types: 1 Cans of beer per week    Comment: occassional  . Drug use: No  . Sexual activity: Yes    Partners: Female    Birth control/protection: None  Lifestyle  . Physical activity:    Days per week: Not on file    Minutes per session: Not on file  . Stress: Not on file  Relationships  . Social connections:    Talks on phone: Not on file    Gets together: Not on file    Attends religious service: Not on file    Active member of club or organization: Not on file    Attends meetings of clubs or organizations: Not on file    Relationship status: Not on file  . Intimate partner violence:    Fear of current or ex partner: Not on file    Emotionally abused: Not on file    Physically abused: Not on file    Forced sexual activity: Not on file  Other Topics Concern  . Not on file  Social History Narrative   Regular exercise, 3-5 times per week.     Health Maintenance  Topic Date Due  . INFLUENZA VACCINE  12/31/2017  . PAP SMEAR  05/05/2021  . TETANUS/TDAP  12/24/2021  . HIV Screening  Completed    The following portions of the patient's history were reviewed and updated as appropriate: allergies, current  medications, past family history, past medical history, past social history, past surgical history and problem list.  Review of Systems A comprehensive review of systems was negative.   Objective:    BP 134/87   Pulse 77   Ht 5\' 2"  (1.575 m)   Wt 130 lb (59 kg)   SpO2 100%   BMI 23.78 kg/m  General appearance: alert, cooperative and appears stated age Head: Normocephalic, without obvious abnormality, atraumatic Eyes: conj clear, EOM, PEERLA Ears: normal TM's and external ear canals both ears Nose: Nares normal. Septum midline. Mucosa normal. No drainage or sinus tenderness. Throat: lips, mucosa, and tongue normal; teeth and gums normal Neck: no adenopathy, no carotid bruit, no JVD, supple, symmetrical, trachea midline and thyroid not enlarged, symmetric, no tenderness/mass/nodules Back: symmetric, no curvature. ROM normal. No CVA tenderness. Lungs: clear to auscultation bilaterally Heart: regular rate and rhythm, S1, S2 normal, no murmur, click, rub or gallop Abdomen: soft, non-tender; bowel sounds normal; no masses,  no organomegaly Extremities: extremities normal, atraumatic, no cyanosis or edema Pulses: 2+ and symmetric Skin: Skin color, texture, turgor normal. No rashes or lesions Lymph nodes:  Cervical, supraclavicular, and axillary nodes normal. Neurologic: Alert and oriented X 3, normal strength and tone. Normal symmetric reflexes. Normal coordination and gait    Assessment:    Healthy female exam.     Plan:     See After Visit Summary for Counseling Recommendations   Keep up a regular exercise program and make sure you are eating a healthy diet Try to eat 4 servings of dairy a day, or if you are lactose intolerant take a calcium with vitamin D daily.  Your vaccines are up to date.  Schedule mammogram.  Refilled Wellbutrin for a year.  She was to continue it for now.  Follow-up appointment with OB/GYN.

## 2017-10-14 ENCOUNTER — Ambulatory Visit: Payer: BC Managed Care – PPO | Admitting: Obstetrics & Gynecology

## 2017-10-14 ENCOUNTER — Encounter: Payer: Self-pay | Admitting: Obstetrics & Gynecology

## 2017-10-14 ENCOUNTER — Ambulatory Visit (INDEPENDENT_AMBULATORY_CARE_PROVIDER_SITE_OTHER): Payer: BC Managed Care – PPO

## 2017-10-14 VITALS — BP 150/88 | HR 84 | Temp 98.0°F | Resp 16 | Wt 131.0 lb

## 2017-10-14 DIAGNOSIS — E288 Other ovarian dysfunction: Secondary | ICD-10-CM

## 2017-10-14 DIAGNOSIS — R928 Other abnormal and inconclusive findings on diagnostic imaging of breast: Secondary | ICD-10-CM

## 2017-10-14 DIAGNOSIS — E2839 Other primary ovarian failure: Secondary | ICD-10-CM

## 2017-10-14 DIAGNOSIS — Z1231 Encounter for screening mammogram for malignant neoplasm of breast: Secondary | ICD-10-CM

## 2017-10-14 LAB — LIPID PANEL
Cholesterol: 220 mg/dL — ABNORMAL HIGH (ref <200)
HDL: 50 mg/dL — ABNORMAL LOW (ref >50)
LDL Cholesterol (Calc): 146 mg/dL (calc) — ABNORMAL HIGH
Non-HDL Cholesterol (Calc): 170 mg/dL (calc) — ABNORMAL HIGH (ref <130)
Total CHOL/HDL Ratio: 4.4 (calc) (ref <5.0)
Triglycerides: 120 mg/dL (ref <150)

## 2017-10-14 LAB — COMPLETE METABOLIC PANEL WITH GFR
AG RATIO: 1.7 (calc) (ref 1.0–2.5)
ALBUMIN MSPROF: 4.3 g/dL (ref 3.6–5.1)
ALT: 24 U/L (ref 6–29)
AST: 17 U/L (ref 10–30)
Alkaline phosphatase (APISO): 45 U/L (ref 33–115)
BILIRUBIN TOTAL: 0.5 mg/dL (ref 0.2–1.2)
BUN: 13 mg/dL (ref 7–25)
CHLORIDE: 107 mmol/L (ref 98–110)
CO2: 25 mmol/L (ref 20–32)
Calcium: 9.4 mg/dL (ref 8.6–10.2)
Creat: 0.84 mg/dL (ref 0.50–1.10)
GFR, Est African American: 100 mL/min/{1.73_m2} (ref >=60)
GFR, Est Non African American: 86 mL/min/{1.73_m2} (ref >=60)
Globulin: 2.5 g/dL (calc) (ref 1.9–3.7)
Glucose, Bld: 98 mg/dL (ref 65–99)
POTASSIUM: 4.5 mmol/L (ref 3.5–5.3)
SODIUM: 140 mmol/L (ref 135–146)
TOTAL PROTEIN: 6.8 g/dL (ref 6.1–8.1)

## 2017-10-14 MED ORDER — NORETHIN ACE-ETH ESTRAD-FE 1-20 MG-MCG(24) PO TABS: 1.0000 | ORAL_TABLET | Freq: Every day | ORAL | 12 refills | Status: DC

## 2017-10-14 NOTE — Progress Notes (Signed)
   Subjective:    Patient ID: Caitlyn Perez, female    DOB: 10-27-1975, 42 y.o.   MRN: [id number]  HPI  42 yo female presents for f/u of premature menopause.  Pt started on OCPs.  She does not bleed during the placebo pills.  Patient has an occasional hot flash.  She does not have any dyspareunia or vaginal symptoms.  Her blood pressure is elevated today.  She says it is normally elevated when she comes to the doctor.  It usually goes down by the end of the visit.  Patient did learn during this visit that she needs follow-up views of her right breast so this could be adding to her elevated blood pressure.  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  Review of Systems  Constitutional: Negative.   Respiratory: Negative.   Cardiovascular: Negative.   Gastrointestinal: Negative.   Genitourinary: Negative.   Neurological: Negative.   Psychiatric/Behavioral: Negative.        Objective:   Physical Exam  Constitutional: She is oriented to person, place, and time. She appears well-developed and well-nourished. No distress.  HENT:  Head: Normocephalic and atraumatic.  Eyes: Conjunctivae are normal.  Cardiovascular: Normal rate.  Pulmonary/Chest: Effort normal.  Musculoskeletal: She exhibits no edema.  Neurological: She is alert and oriented to person, place, and time.  Skin: Skin is warm and dry.  Psychiatric: She has a normal mood and affect.  Vitals reviewed.  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  Pt will continue to have Pap smears with Dr. Madilyn Fireman. 2.  Normal right mammogram.  Follow-up views ordered today. 3.  HTN-patient to take blood pressure at school and to follow-up with Dr. Charise Carwin. 4.  1  year refill on oral contraceptives given.

## 2017-10-15 NOTE — Progress Notes (Signed)
Call pt: kidney, liver, glucose look good.  Cholesterol is stable from last year. LDL still not to goal but in lieu of no risk factors CV 10 year risk is 1.2 percent and very low. HDL up some and good news. Continue to exercise and keep a low fat diet. Recheck in 1 year.

## 2017-10-16 ENCOUNTER — Ambulatory Visit
Admission: RE | Admit: 2017-10-16 | Discharge: 2017-10-16 | Disposition: A | Discharge status: Home / Self Care | Payer: BC Managed Care – PPO | Source: Ambulatory Visit | Attending: Obstetrics & Gynecology | Admitting: Obstetrics & Gynecology

## 2017-10-16 DIAGNOSIS — R928 Other abnormal and inconclusive findings on diagnostic imaging of breast: Secondary | ICD-10-CM

## 2018-09-24 ENCOUNTER — Other Ambulatory Visit: Payer: Self-pay | Admitting: Family Medicine

## 2018-10-19 ENCOUNTER — Encounter: Payer: BC Managed Care – PPO | Admitting: Family Medicine

## 2018-10-20 ENCOUNTER — Other Ambulatory Visit: Payer: Self-pay | Admitting: Family Medicine

## 2018-10-27 ENCOUNTER — Encounter: Payer: Self-pay | Admitting: Family Medicine

## 2018-10-27 DIAGNOSIS — Z1239 Encounter for other screening for malignant neoplasm of breast: Secondary | ICD-10-CM

## 2018-11-11 ENCOUNTER — Ambulatory Visit (INDEPENDENT_AMBULATORY_CARE_PROVIDER_SITE_OTHER): Payer: BC Managed Care – PPO

## 2018-11-11 ENCOUNTER — Other Ambulatory Visit: Payer: Self-pay

## 2018-11-11 DIAGNOSIS — Z1239 Encounter for other screening for malignant neoplasm of breast: Secondary | ICD-10-CM | POA: Diagnosis not present

## 2018-11-15 ENCOUNTER — Other Ambulatory Visit: Payer: Self-pay | Admitting: Family Medicine

## 2018-11-15 DIAGNOSIS — R928 Other abnormal and inconclusive findings on diagnostic imaging of breast: Secondary | ICD-10-CM

## 2018-11-18 ENCOUNTER — Ambulatory Visit
Admission: RE | Admit: 2018-11-18 | Discharge: 2018-11-18 | Disposition: A | Discharge status: Home / Self Care | Payer: BC Managed Care – PPO | Source: Ambulatory Visit | Attending: Family Medicine | Admitting: Family Medicine

## 2018-11-18 ENCOUNTER — Other Ambulatory Visit: Payer: Self-pay

## 2018-11-18 DIAGNOSIS — R928 Other abnormal and inconclusive findings on diagnostic imaging of breast: Secondary | ICD-10-CM

## 2018-11-30 ENCOUNTER — Other Ambulatory Visit (HOSPITAL_COMMUNITY)
Admission: RE | Admit: 2018-11-30 | Discharge: 2018-11-30 | Disposition: A | Discharge status: Home / Self Care | Payer: BC Managed Care – PPO | Source: Ambulatory Visit | Attending: Family Medicine | Admitting: Family Medicine

## 2018-11-30 ENCOUNTER — Encounter: Payer: Self-pay | Admitting: Family Medicine

## 2018-11-30 ENCOUNTER — Ambulatory Visit (INDEPENDENT_AMBULATORY_CARE_PROVIDER_SITE_OTHER): Payer: BC Managed Care – PPO | Admitting: Family Medicine

## 2018-11-30 VITALS — BP 122/88 | HR 66 | Ht 62.0 in | Wt 134.0 lb

## 2018-11-30 DIAGNOSIS — Z0000 Encounter for general adult medical examination without abnormal findings: Secondary | ICD-10-CM

## 2018-11-30 DIAGNOSIS — N760 Acute vaginitis: Secondary | ICD-10-CM

## 2018-11-30 DIAGNOSIS — Z124 Encounter for screening for malignant neoplasm of cervix: Secondary | ICD-10-CM

## 2018-11-30 MED ORDER — NORETHIN ACE-ETH ESTRAD-FE 1-20 MG-MCG(24) PO TABS: 1.0000 | ORAL_TABLET | Freq: Every day | ORAL | 12 refills | Status: DC

## 2018-11-30 MED ORDER — BUPROPION HCL ER (XL) 150 MG PO TB24: 150.0000 mg | ORAL_TABLET | Freq: Every day | ORAL | 0 refills | Status: DC

## 2018-11-30 NOTE — Progress Notes (Signed)
Subjective:     Caitlyn Perez is a 43 y.o. female and is here for a comprehensive physical exam. The patient reports no problems.  She would like to taper off of her Wellbutrin and she wanted to discuss the birth control and potential increased risk for breast cancer.  She says when she went for her follow-up mammogram because of some newer cysts that were seen that they had suggested that she discuss her hormone therapy with her provider.  The recommendation was initially for her to take the hormones until she turned 51 to get her close to when she would normally go through menopause.  She just wants to make sure it safe.  She has no family history or personal history of breast cancer.  Social History   Socioeconomic History  . Marital status: Married    Spouse name: Not on file  . Number of children: 2  . Years of education: Not on file  . Highest education level: Not on file  Occupational History  . Occupation: Pharmacist, hospital    Comment: [place]  . Financial resource strain: Not on file  . Food insecurity    Worry: Not on file    Inability: Not on file  . Transportation needs    Medical: Not on file    Non-medical: Not on file  Tobacco Use  . Smoking status: Never Smoker  . Smokeless tobacco: Never Used  Substance and Sexual Activity  . Alcohol use: Yes    Alcohol/week: 1.0 standard drinks    Types: 1 Cans of beer per week    Comment: occassional  . Drug use: No  . Sexual activity: Yes    Partners: Female    Birth control/protection: None  Lifestyle  . Physical activity    Days per week: Not on file    Minutes per session: Not on file  . Stress: Not on file  Relationships  . Social Herbalist on phone: Not on file    Gets together: Not on file    Attends religious service: Not on file    Active member of club or organization: Not on file    Attends meetings of clubs or organizations: Not on file    Relationship status: Not on file   . Intimate partner violence    Fear of current or ex partner: Not on file    Emotionally abused: Not on file    Physically abused: Not on file    Forced sexual activity: Not on file  Other Topics Concern  . Not on file  Social History Narrative   Regular exercise, 3-5 times per week.     Health Maintenance  Topic Date Due  . INFLUENZA VACCINE  01/01/2019  . PAP SMEAR-Modifier  05/05/2021  . TETANUS/TDAP  12/24/2021  . HIV Screening  Completed    The following portions of the patient's history were reviewed and updated as appropriate: allergies, current medications, past family history, past medical history, past social history, past surgical history and problem list.  Review of Systems A comprehensive review of systems was negative.   Objective:    BP 122/88   Pulse 66   Ht 5\' 2"  (1.575 m)   Wt 134 lb (60.8 kg)   LMP 11/30/2015   SpO2 100%   BMI 24.51 kg/m  General appearance: alert, cooperative and appears stated age Head: Normocephalic, without obvious abnormality, atraumatic Eyes: conj clear. EOMI, PEERLA Ears: normal TM's and  external ear canals both ears Nose: Nares normal. Septum midline. Mucosa normal. No drainage or sinus tenderness. Throat: lips, mucosa, and tongue normal; teeth and gums normal Neck: no adenopathy, no carotid bruit, no JVD, supple, symmetrical, trachea midline and thyroid not enlarged, symmetric, no tenderness/mass/nodules Back: symmetric, no curvature. ROM normal. No CVA tenderness. Lungs: clear to auscultation bilaterally Breasts: normal appearance, no masses or tenderness Heart: regular rate and rhythm, S1, S2 normal, no murmur, click, rub or gallop Abdomen: soft, non-tender; bowel sounds normal; no masses,  no organomegaly Pelvic: cervix normal in appearance, external genitalia normal, no adnexal masses or tenderness, no cervical motion tenderness, rectovaginal septum normal, uterus normal size, shape, and consistency and vagina normal  without discharge Extremities: extremities normal, atraumatic, no cyanosis or edema Pulses: 2+ and symmetric Skin: Skin color, texture, turgor normal. No rashes or lesions Lymph nodes: Cervical, supraclavicular, and axillary nodes normal. Neurologic: Alert and oriented X 3, normal strength and tone. Normal symmetric reflexes. Normal coordination and gait    Assessment:    Healthy female exam.      Plan:     See After Visit Summary for Counseling Recommendations    Keep up a regular exercise program and make sure you are eating a healthy diet Try to eat 4 servings of dairy a day, or if you are lactose intolerant take a calcium with vitamin D daily.  Your vaccines are up to date.    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  We discussed that overall studies show no significant increased risk of breast cancer in patients who are overall low risk.  Not certainly if she had a personal history of family history then I would not recommend that she continue hormones for extended period of time.  Still encouraged her to get her yearly mammogram to be on the safe side and to continue monitoring while on oral contraceptives.  Stress-we will go ahead and start weaning the Wellbutrin off over the next month if she has any problems please let me know.  We will have her drop down to 150 mg daily for 2 weeks, and then 1 tab every other day for 2 weeks and then stop.

## 2018-11-30 NOTE — Patient Instructions (Addendum)
Decrease the Wellbutrin to 150 mg daily for 2 weeks and then 1 tab every other day for 2 weeks and then stop.     Preventive Care 42-42 Years Old, Female Preventive care refers to visits with your health care provider and lifestyle choices that can promote health and wellness. This includes:  A yearly physical exam. This may also be called an annual well check.  Regular dental visits and eye exams.  Immunizations.  Screening for certain conditions.  Healthy lifestyle choices, such as eating a healthy diet, getting regular exercise, not using drugs or products that contain nicotine and tobacco, and limiting alcohol use. What can I expect for my preventive care visit? Physical exam Your health care provider will check your:  Height and weight. This may be used to calculate body mass index (BMI), which tells if you are at a healthy weight.  Heart rate and blood pressure.  Skin for abnormal spots. Counseling Your health care provider may ask you questions about your:  Alcohol, tobacco, and drug use.  Emotional well-being.  Home and relationship well-being.  Sexual activity.  Eating habits.  Work and work Statistician.  Method of birth control.  Menstrual cycle.  Pregnancy history. What immunizations do I need?  Influenza (flu) vaccine  This is recommended every year. Tetanus, diphtheria, and pertussis (Tdap) vaccine  You may need a Td booster every 10 years. Varicella (chickenpox) vaccine  You may need this if you have not been vaccinated. Human papillomavirus (HPV) vaccine  If recommended by your health care provider, you may need three doses over 6 months. Measles, mumps, and rubella (MMR) vaccine  You may need at least one dose of MMR. You may also need a second dose. Meningococcal conjugate (MenACWY) vaccine  One dose is recommended if you are age 73-21 years and a first-year college student living in a residence hall, or if you have one of several  medical conditions. You may also need additional booster doses. Pneumococcal conjugate (PCV13) vaccine  You may need this if you have certain conditions and were not previously vaccinated. Pneumococcal polysaccharide (PPSV23) vaccine  You may need one or two doses if you smoke cigarettes or if you have certain conditions. Hepatitis A vaccine  You may need this if you have certain conditions or if you travel or work in places where you may be exposed to hepatitis A. Hepatitis B vaccine  You may need this if you have certain conditions or if you travel or work in places where you may be exposed to hepatitis B. Haemophilus influenzae type b (Hib) vaccine  You may need this if you have certain conditions. You may receive vaccines as individual doses or as more than one vaccine together in one shot (combination vaccines). Talk with your health care provider about the risks and benefits of combination vaccines. What tests do I need?  Blood tests  Lipid and cholesterol levels. These may be checked every 5 years starting at age 35.  Hepatitis C test.  Hepatitis B test. Screening  Diabetes screening. This is done by checking your blood sugar (glucose) after you have not eaten for a while (fasting).  Sexually transmitted disease (STD) testing.  BRCA-related cancer screening. This may be done if you have a family history of breast, ovarian, tubal, or peritoneal cancers.  Pelvic exam and Pap test. This may be done every 3 years starting at age 20. Starting at age 42, this may be done every 5 years if you have a Pap  test in combination with an HPV test. Talk with your health care provider about your test results, treatment options, and if necessary, the need for more tests. Follow these instructions at home: Eating and drinking   Eat a diet that includes fresh fruits and vegetables, whole grains, lean protein, and low-fat dairy.  Take vitamin and mineral supplements as recommended by  your health care provider.  Do not drink alcohol if: ? Your health care provider tells you not to drink. ? You are pregnant, may be pregnant, or are planning to become pregnant.  If you drink alcohol: ? Limit how much you have to 0-1 drink a day. ? Be aware of how much alcohol is in your drink. In the [place] one drink equals one 12 oz bottle of beer (355 mL), one 5 oz glass of wine (148 mL), or one 1 oz glass of hard liquor (44 mL). Lifestyle  Take daily care of your teeth and gums.  Stay active. Exercise for at least 30 minutes on 5 or more days each week.  Do not use any products that contain nicotine or tobacco, such as cigarettes, e-cigarettes, and chewing tobacco. If you need help quitting, ask your health care provider.  If you are sexually active, practice safe sex. Use a condom or other form of birth control (contraception) in order to prevent pregnancy and STIs (sexually transmitted infections). If you plan to become pregnant, see your health care provider for a preconception visit. What's next?  Visit your health care provider once a year for a well check visit.  Ask your health care provider how often you should have your eyes and teeth checked.  Stay up to date on all vaccines. This information is not intended to replace advice given to you by your health care provider. Make sure you discuss any questions you have with your health care provider. Document Released: 07/15/2001 Document Revised: 01/28/2018 Document Reviewed: 01/28/2018 Elsevier Patient Education  2020 [employer].

## 2018-12-01 LAB — WET PREP FOR TRICH, YEAST, CLUE
MICRO NUMBER:: 621356
Specimen Quality: ADEQUATE

## 2018-12-01 LAB — CYTOLOGY - PAP
Diagnosis: NEGATIVE
HPV: NOT DETECTED

## 2018-12-01 NOTE — Progress Notes (Signed)
Call patient: Your Pap smear is normal. Repeat in 5 years.

## 2018-12-02 LAB — COMPLETE METABOLIC PANEL WITH GFR
AG Ratio: 1.9 (calc) (ref 1.0–2.5)
ALT: 11 U/L (ref 6–29)
AST: 14 U/L (ref 10–30)
Albumin: 4.1 g/dL (ref 3.6–5.1)
Alkaline phosphatase (APISO): 37 U/L (ref 31–125)
BUN: 11 mg/dL (ref 7–25)
CO2: 26 mmol/L (ref 20–32)
Calcium: 9.1 mg/dL (ref 8.6–10.2)
Chloride: 106 mmol/L (ref 98–110)
Creat: 1.09 mg/dL (ref 0.50–1.10)
GFR, Est African American: 73 mL/min/{1.73_m2} (ref >=60)
GFR, Est Non African American: 63 mL/min/{1.73_m2} (ref >=60)
Globulin: 2.2 g/dL (calc) (ref 1.9–3.7)
Glucose, Bld: 87 mg/dL (ref 65–99)
Potassium: 4.3 mmol/L (ref 3.5–5.3)
Sodium: 139 mmol/L (ref 135–146)
Total Bilirubin: 0.5 mg/dL (ref 0.2–1.2)
Total Protein: 6.3 g/dL (ref 6.1–8.1)

## 2018-12-02 LAB — CBC
HCT: 43.7 % (ref 35.0–45.0)
Hemoglobin: 14.8 g/dL (ref 11.7–15.5)
MCH: 32.1 pg (ref 27.0–33.0)
MCHC: 33.9 g/dL (ref 32.0–36.0)
MCV: 94.8 fL (ref 80.0–100.0)
MPV: 8.9 fL (ref 7.5–12.5)
Platelets: 289 10*3/uL (ref 140–400)
RBC: 4.61 10*6/uL (ref 3.80–5.10)
RDW: 11.4 % (ref 11.0–15.0)
WBC: 5.2 10*3/uL (ref 3.8–10.8)

## 2018-12-02 LAB — LIPID PANEL
Cholesterol: 230 mg/dL — ABNORMAL HIGH (ref <200)
HDL: 46 mg/dL — ABNORMAL LOW (ref >=50)
LDL Cholesterol (Calc): 158 mg/dL (calc) — ABNORMAL HIGH
Non-HDL Cholesterol (Calc): 184 mg/dL (calc) — ABNORMAL HIGH (ref <130)
Total CHOL/HDL Ratio: 5 (calc) — ABNORMAL HIGH (ref <5.0)
Triglycerides: 137 mg/dL (ref <150)

## 2018-12-23 ENCOUNTER — Other Ambulatory Visit: Payer: Self-pay | Admitting: Family Medicine

## 2019-01-11 ENCOUNTER — Encounter: Payer: Self-pay | Admitting: Family Medicine

## 2019-01-11 NOTE — Telephone Encounter (Signed)
She had a physical in June. I have scheduled her for a PPD placement for tomorrow.

## 2019-01-12 ENCOUNTER — Other Ambulatory Visit: Payer: Self-pay

## 2019-01-12 ENCOUNTER — Ambulatory Visit: Payer: BC Managed Care – PPO | Admitting: Family Medicine

## 2019-01-12 VITALS — BP 138/89 | HR 65 | Wt 135.0 lb

## 2019-01-12 DIAGNOSIS — Z111 Encounter for screening for respiratory tuberculosis: Secondary | ICD-10-CM

## 2019-01-12 NOTE — Progress Notes (Signed)
Established Patient Office Visit  Subjective:  Patient ID: Caitlyn CHAVERO, female    DOB: 09-Aug-1975  Age: 43 y.o. MRN: [id number]  CC:  Chief Complaint  Patient presents with  . PPD Placement    HPI ADELY FACER presents for PPD placement.   Past Medical History:  Diagnosis Date  . Cesarean delivery delivered 2010, 2011  . Umbilical hernia 1-76-16    Past Surgical History:  Procedure Laterality Date  . CESAREAN SECTION    . TUBAL LIGATION  BTL at time of 2nd cesarean section  . umbical hernia repair  11/2010    Family History  Problem Relation Age of Onset  . Lung cancer Maternal Grandmother 70       lung  . Diabetes Paternal Grandmother     Social History   Socioeconomic History  . Marital status: Married    Spouse name: Not on file  . Number of children: 2  . Years of education: Not on file  . Highest education level: Not on file  Occupational History  . Occupation: Pharmacist, hospital    Comment: [place]  . Financial resource strain: Not on file  . Food insecurity    Worry: Not on file    Inability: Not on file  . Transportation needs    Medical: Not on file    Non-medical: Not on file  Tobacco Use  . Smoking status: Never Smoker  . Smokeless tobacco: Never Used  Substance and Sexual Activity  . Alcohol use: Yes    Alcohol/week: 1.0 standard drinks    Types: 1 Cans of beer per week    Comment: occassional  . Drug use: No  . Sexual activity: Yes    Partners: Female    Birth control/protection: None  Lifestyle  . Physical activity    Days per week: Not on file    Minutes per session: Not on file  . Stress: Not on file  Relationships  . Social Herbalist on phone: Not on file    Gets together: Not on file    Attends religious service: Not on file    Active member of club or organization: Not on file    Attends meetings of clubs or organizations: Not on file    Relationship status: Not on file  .  Intimate partner violence    Fear of current or ex partner: Not on file    Emotionally abused: Not on file    Physically abused: Not on file    Forced sexual activity: Not on file  Other Topics Concern  . Not on file  Social History Narrative   Regular exercise, 3-5 times per week.      Outpatient Medications Prior to Visit  Medication Sig Dispense Refill  . buPROPion (WELLBUTRIN XL) 150 MG 24 hr tablet Take 1 tablet (150 mg total) by mouth daily. 90 tablet 1  . Norethindrone Acetate-Ethinyl Estrad-FE (LOESTRIN 24 FE) 1-20 MG-MCG(24) tablet Take 1 tablet by mouth daily. 1 Package 12   No facility-administered medications prior to visit.     No Known Allergies  ROS Review of Systems    Objective:    Physical Exam  BP 138/89   Pulse 65   Wt 135 lb (61.2 kg)   SpO2 100%   BMI 24.69 kg/m  Wt Readings from Last 3 Encounters:  01/12/19 135 lb (61.2 kg)  11/30/18 134 lb (60.8 kg)  10/14/17 131 lb (59.4 kg)  Health Maintenance Due  Topic Date Due  . INFLUENZA VACCINE  01/01/2019    There are no preventive care reminders to display for this patient.  Lab Results  Component Value Date   TSH 2.38 05/05/2016   Lab Results  Component Value Date   WBC 5.2 12/01/2018   HGB 14.8 12/01/2018   HCT 43.7 12/01/2018   MCV 94.8 12/01/2018   PLT 289 12/01/2018   Lab Results  Component Value Date   NA 139 12/01/2018   K 4.3 12/01/2018   CO2 26 12/01/2018   GLUCOSE 87 12/01/2018   BUN 11 12/01/2018   CREATININE 1.09 12/01/2018   BILITOT 0.5 12/01/2018   ALKPHOS 50 05/05/2016   AST 14 12/01/2018   ALT 11 12/01/2018   PROT 6.3 12/01/2018   ALBUMIN 4.3 05/05/2016   CALCIUM 9.1 12/01/2018   Lab Results  Component Value Date   CHOL 230 (H) 12/01/2018   Lab Results  Component Value Date   HDL 46 (L) 12/01/2018   Lab Results  Component Value Date   LDLCALC 158 (H) 12/01/2018   Lab Results  Component Value Date   TRIG 137 12/01/2018   Lab Results   Component Value Date   CHOLHDL 5.0 (H) 12/01/2018   No results found for: HGBA1C    Assessment & Plan:  PPD placement - Patient tolerated injection well without complications. Patient advised to schedule next injection 2 days from today.    Problem List Items Addressed This Visit    None    Visit Diagnoses    Screening-pulmonary TB    -  Primary   Relevant Orders   PPD (Completed)      No orders of the defined types were placed in this encounter.   Follow-up: Return in about 2 days (around 01/14/2019) for PPD read.Durene Romans, Monico Blitz, [place]

## 2019-01-12 NOTE — Progress Notes (Signed)
Agree with documentation as above.   Aithana Kushner, MD  

## 2019-01-14 ENCOUNTER — Ambulatory Visit (INDEPENDENT_AMBULATORY_CARE_PROVIDER_SITE_OTHER): Payer: BC Managed Care – PPO | Admitting: Family Medicine

## 2019-01-14 ENCOUNTER — Other Ambulatory Visit: Payer: Self-pay

## 2019-01-14 VITALS — BP 143/84 | HR 77 | Temp 98.0°F | Ht 62.0 in | Wt 135.0 lb

## 2019-01-14 DIAGNOSIS — Z111 Encounter for screening for respiratory tuberculosis: Secondary | ICD-10-CM

## 2019-01-14 LAB — TB SKIN TEST
Induration: 0 mm
TB Skin Test: NEGATIVE

## 2019-01-14 NOTE — Progress Notes (Signed)
Patient presents to clinic for PPD read on right forearm. PPD was negative and was 0 mm. Patient needs this for her work at [employer] for the [employer]. Patient was handed a copy of the results and her form from PCP as well. She did not have any other needs during the visit. A copy of the form will be sent to scan in patients chart. She was appreciative.

## 2019-11-04 ENCOUNTER — Ambulatory Visit: Payer: BC Managed Care – PPO | Admitting: Family Medicine

## 2019-11-09 ENCOUNTER — Other Ambulatory Visit: Payer: Self-pay | Admitting: Family Medicine

## 2019-11-18 ENCOUNTER — Encounter: Payer: Self-pay | Admitting: Family Medicine

## 2019-11-18 ENCOUNTER — Other Ambulatory Visit: Payer: Self-pay

## 2019-11-18 DIAGNOSIS — Z1231 Encounter for screening mammogram for malignant neoplasm of breast: Secondary | ICD-10-CM

## 2019-11-18 NOTE — Progress Notes (Signed)
mammao

## 2019-12-06 ENCOUNTER — Telehealth: Payer: Self-pay | Admitting: Family Medicine

## 2019-12-06 DIAGNOSIS — Z0000 Encounter for general adult medical examination without abnormal findings: Secondary | ICD-10-CM

## 2019-12-06 NOTE — Telephone Encounter (Signed)
I have PT scheduled on 12/30/19 for an annual physical.  Please send bloodwork [facility] to [facility].

## 2019-12-06 NOTE — Telephone Encounter (Signed)
Pt scheduled for annual visit on 12/30/19. Labs ordered for annual physical. Pended for provider review.

## 2019-12-06 NOTE — Addendum Note (Signed)
Addended by: Mertha Finders on: 12/06/2019 02:56 PM   Modules accepted: Orders

## 2019-12-07 ENCOUNTER — Other Ambulatory Visit: Payer: Self-pay

## 2019-12-07 ENCOUNTER — Ambulatory Visit (INDEPENDENT_AMBULATORY_CARE_PROVIDER_SITE_OTHER): Payer: BC Managed Care – PPO

## 2019-12-07 DIAGNOSIS — Z1231 Encounter for screening mammogram for malignant neoplasm of breast: Secondary | ICD-10-CM

## 2019-12-07 NOTE — Addendum Note (Signed)
Addended by: Beatrice Lecher D on: 12/07/2019 01:40 PM   Modules accepted: Orders

## 2019-12-30 ENCOUNTER — Encounter: Payer: Self-pay | Admitting: Family Medicine

## 2019-12-30 ENCOUNTER — Other Ambulatory Visit: Payer: Self-pay

## 2019-12-30 ENCOUNTER — Ambulatory Visit: Payer: BC Managed Care – PPO | Admitting: Family Medicine

## 2019-12-30 ENCOUNTER — Encounter: Payer: BC Managed Care – PPO | Admitting: Family Medicine

## 2019-12-30 VITALS — BP 135/90 | HR 82 | Ht 62.0 in | Wt 135.0 lb

## 2019-12-30 DIAGNOSIS — Z0000 Encounter for general adult medical examination without abnormal findings: Secondary | ICD-10-CM | POA: Diagnosis not present

## 2019-12-30 MED ORDER — PHENTERMINE HCL 37.5 MG PO CAPS: 37.5000 mg | ORAL_CAPSULE | ORAL | 0 refills | Status: DC

## 2019-12-30 NOTE — Patient Instructions (Signed)
Health Maintenance, Female Adopting a healthy lifestyle and getting preventive care are important in promoting health and wellness. Ask your health care provider about:  The right schedule for you to have regular tests and exams.  Things you can do on your own to prevent diseases and keep yourself healthy. What should I know about diet, weight, and exercise? Eat a healthy diet   Eat a diet that includes plenty of vegetables, fruits, low-fat dairy products, and lean protein.  Do not eat a lot of foods that are high in solid fats, added sugars, or sodium. Maintain a healthy weight Body mass index (BMI) is used to identify weight problems. It estimates body fat based on height and weight. Your health care provider can help determine your BMI and help you achieve or maintain a healthy weight. Get regular exercise Get regular exercise. This is one of the most important things you can do for your health. Most adults should:  Exercise for at least 150 minutes each week. The exercise should increase your heart rate and make you sweat (moderate-intensity exercise).  Do strengthening exercises at least twice a week. This is in addition to the moderate-intensity exercise.  Spend less time sitting. Even light physical activity can be beneficial. Watch cholesterol and blood lipids Have your blood tested for lipids and cholesterol at 43 years of age, then have this test every 5 years. Have your cholesterol levels checked more often if:  Your lipid or cholesterol levels are high.  You are older than 43 years of age.  You are at high risk for heart disease. What should I know about cancer screening? Depending on your health history and family history, you may need to have cancer screening at various ages. This may include screening for:  Breast cancer.  Cervical cancer.  Colorectal cancer.  Skin cancer.  Lung cancer. What should I know about heart disease, diabetes, and high blood  pressure? Blood pressure and heart disease  High blood pressure causes heart disease and increases the risk of stroke. This is more likely to develop in people who have high blood pressure readings, are of African descent, or are overweight.  Have your blood pressure checked: ? Every 3-5 years if you are 18-39 years of age. ? Every year if you are 40 years old or older. Diabetes Have regular diabetes screenings. This checks your fasting blood sugar level. Have the screening done:  Once every three years after age 40 if you are at a normal weight and have a low risk for diabetes.  More often and at a younger age if you are overweight or have a high risk for diabetes. What should I know about preventing infection? Hepatitis B If you have a higher risk for hepatitis B, you should be screened for this virus. Talk with your health care provider to find out if you are at risk for hepatitis B infection. Hepatitis C Testing is recommended for:  Everyone born from 1945 through 1965.  Anyone with known risk factors for hepatitis C. Sexually transmitted infections (STIs)  Get screened for STIs, including gonorrhea and chlamydia, if: ? You are sexually active and are younger than 43 years of age. ? You are older than 43 years of age and your health care provider tells you that you are at risk for this type of infection. ? Your sexual activity has changed since you were last screened, and you are at increased risk for chlamydia or gonorrhea. Ask your health care provider if   you are at risk.  Ask your health care provider about whether you are at high risk for HIV. Your health care provider may recommend a prescription medicine to help prevent HIV infection. If you choose to take medicine to prevent HIV, you should first get tested for HIV. You should then be tested every 3 months for as long as you are taking the medicine. Pregnancy  If you are about to stop having your period (premenopausal) and  you may become pregnant, seek counseling before you get pregnant.  Take 400 to 800 micrograms (mcg) of folic acid every day if you become pregnant.  Ask for birth control (contraception) if you want to prevent pregnancy. Osteoporosis and menopause Osteoporosis is a disease in which the bones lose minerals and strength with aging. This can result in bone fractures. If you are 65 years old or older, or if you are at risk for osteoporosis and fractures, ask your health care provider if you should:  Be screened for bone loss.  Take a calcium or vitamin D supplement to lower your risk of fractures.  Be given hormone replacement therapy (HRT) to treat symptoms of menopause. Follow these instructions at home: Lifestyle  Do not use any products that contain nicotine or tobacco, such as cigarettes, e-cigarettes, and chewing tobacco. If you need help quitting, ask your health care provider.  Do not use street drugs.  Do not share needles.  Ask your health care provider for help if you need support or information about quitting drugs. Alcohol use  Do not drink alcohol if: ? Your health care provider tells you not to drink. ? You are pregnant, may be pregnant, or are planning to become pregnant.  If you drink alcohol: ? Limit how much you use to 0-1 drink a day. ? Limit intake if you are breastfeeding.  Be aware of how much alcohol is in your drink. In the [place] one drink equals one 12 oz bottle of beer (355 mL), one 5 oz glass of wine (148 mL), or one 1 oz glass of hard liquor (44 mL). General instructions  Schedule regular health, dental, and eye exams.  Stay current with your vaccines.  Tell your health care provider if: ? You often feel depressed. ? You have ever been abused or do not feel safe at home. Summary  Adopting a healthy lifestyle and getting preventive care are important in promoting health and wellness.  Follow your health care provider's instructions about healthy  diet, exercising, and getting tested or screened for diseases.  Follow your health care provider's instructions on monitoring your cholesterol and blood pressure. This information is not intended to replace advice given to you by your health care provider. Make sure you discuss any questions you have with your health care provider. Document Revised: 05/12/2018 Document Reviewed: 05/12/2018 Elsevier Patient Education  2020 [employer]  

## 2019-12-30 NOTE — Progress Notes (Signed)
Subjective:     Caitlyn Perez is a 44 y.o. female and is here for a comprehensive physical exam. The patient reports problems - weight.  Overall she is doing well.  She still eating very healthy and exercises regularly.  She feels like since she is at menopause she is just really struggled with the ability to lose weight.  She said for a while she was running 20 to 25 miles a week and eating healthy and could not get her weight to shift at all.  She is interested in trying phentermine again she took it for a while back in 2016 and 2017.  She tolerated it well with and without any side effects at that time.  She denies any recent chest pain or shortness of breath.  Social History   Socioeconomic History  . Marital status: Married    Spouse name: Not on file  . Number of children: 2  . Years of education: Not on file  . Highest education level: Not on file  Occupational History  . Occupation: Pharmacist, hospital    Comment: [employer]  Tobacco Use  . Smoking status: Never Smoker  . Smokeless tobacco: Never Used  Vaping Use  . Vaping Use: Never used  Substance and Sexual Activity  . Alcohol use: Yes    Alcohol/week: 1.0 standard drink    Types: 1 Cans of beer per week    Comment: occassional  . Drug use: No  . Sexual activity: Yes    Partners: Female    Birth control/protection: None  Other Topics Concern  . Not on file  Social History Narrative   Regular exercise, 3-5 times per week.     Social Determinants of Health   Financial Resource Strain:   . Difficulty of Paying Living Expenses:   Food Insecurity:   . Worried About Charity fundraiser in the Last Year:   . Arboriculturist in the Last Year:   Transportation Needs:   . Film/video editor (Medical):   Marland Kitchen Lack of Transportation (Non-Medical):   Physical Activity:   . Days of Exercise per Week:   . Minutes of Exercise per Session:   Stress:   . Feeling of Stress :   Social Connections:   . Frequency of  Communication with Friends and Family:   . Frequency of Social Gatherings with Friends and Family:   . Attends Religious Services:   . Active Member of Clubs or Organizations:   . Attends Archivist Meetings:   Marland Kitchen Marital Status:   Intimate Partner Violence:   . Fear of Current or Ex-Partner:   . Emotionally Abused:   Marland Kitchen Physically Abused:   . Sexually Abused:    Health Maintenance  Topic Date Due  . Hepatitis C Screening  Never done  . INFLUENZA VACCINE  01/01/2020  . TETANUS/TDAP  12/24/2021  . PAP SMEAR-Modifier  11/30/2023  . COVID-19 Vaccine  Completed  . HIV Screening  Completed    The following portions of the patient's history were reviewed and updated as appropriate: allergies, current medications, past family history, past medical history, past social history, past surgical history and problem list.  Review of Systems A comprehensive review of systems was negative.   Objective:    BP (!) 135/90   Pulse 82   Ht 5\' 2"  (1.575 m)   Wt 135 lb (61.2 kg)   SpO2 98%   BMI 24.69 kg/m  General appearance: alert, cooperative and  appears stated age Head: Normocephalic, without obvious abnormality, atraumatic Eyes: conj clear, EOMi, PEERLA Ears: normal TM's and external ear canals both ears Nose: Nares normal. Septum midline. Mucosa normal. No drainage or sinus tenderness. Throat: lips, mucosa, and tongue normal; teeth and gums normal Neck: no adenopathy, no carotid bruit, no JVD, supple, symmetrical, trachea midline and thyroid not enlarged, symmetric, no tenderness/mass/nodules Back: symmetric, no curvature. ROM normal. No CVA tenderness. Lungs: clear to auscultation bilaterally Heart: regular rate and rhythm, S1, S2 normal, no murmur, click, rub or gallop Abdomen: soft, non-tender; bowel sounds normal; no masses,  no organomegaly Extremities: extremities normal, atraumatic, no cyanosis or edema Pulses: 2+ and symmetric Skin: Skin color, texture, turgor normal.  No rashes or lesions Lymph nodes: Cervical, supraclavicular, and axillary nodes normal. Neurologic: Alert and oriented X 3, normal strength and tone. Normal symmetric reflexes. Normal coordination and gait    Assessment:    Healthy female exam.      Plan:     See After Visit Summary for Counseling Recommendations   Keep up a regular exercise program and make sure you are eating a healthy diet Try to eat 4 servings of dairy a day, or if you are lactose intolerant take a calcium with vitamin D daily.  Your vaccines are up to date.  Due for updated blood work she does have a history of elevated cholesterol levels with an LDL at 158.   Abnormal weight gain-she is interested in restarting phentermine which she did well on in the past.  New prescription sent to pharmacy.  Monitor for any chest pain shortness of breath or palpitations.  Blood pressure is a little borderline today.  She will keep an eye on it especially with the phentermine will see her back in 1 month for weight and blood pressure check.

## 2020-01-02 ENCOUNTER — Encounter: Payer: Self-pay | Admitting: Family Medicine

## 2020-01-02 LAB — COMPLETE METABOLIC PANEL WITH GFR
AG Ratio: 1.6 (calc) (ref 1.0–2.5)
ALT: 20 U/L (ref 6–29)
AST: 18 U/L (ref 10–30)
Albumin: 4.2 g/dL (ref 3.6–5.1)
Alkaline phosphatase (APISO): 45 U/L (ref 31–125)
BUN: 13 mg/dL (ref 7–25)
CO2: 27 mmol/L (ref 20–32)
Calcium: 9.1 mg/dL (ref 8.6–10.2)
Chloride: 104 mmol/L (ref 98–110)
Creat: 0.84 mg/dL (ref 0.50–1.10)
GFR, Est African American: 99 mL/min/{1.73_m2} (ref >=60)
GFR, Est Non African American: 85 mL/min/{1.73_m2} (ref >=60)
Globulin: 2.6 g/dL (calc) (ref 1.9–3.7)
Glucose, Bld: 122 mg/dL — ABNORMAL HIGH (ref 65–99)
Potassium: 4.2 mmol/L (ref 3.5–5.3)
Sodium: 138 mmol/L (ref 135–146)
Total Bilirubin: 0.6 mg/dL (ref 0.2–1.2)
Total Protein: 6.8 g/dL (ref 6.1–8.1)

## 2020-01-02 LAB — LIPID PANEL
Cholesterol: 211 mg/dL — ABNORMAL HIGH (ref <200)
HDL: 41 mg/dL — ABNORMAL LOW (ref >=50)
LDL Cholesterol (Calc): 147 mg/dL (calc) — ABNORMAL HIGH
Non-HDL Cholesterol (Calc): 170 mg/dL (calc) — ABNORMAL HIGH (ref <130)
Total CHOL/HDL Ratio: 5.1 (calc) — ABNORMAL HIGH (ref <5.0)
Triglycerides: 109 mg/dL (ref <150)

## 2020-01-02 LAB — CBC
HCT: 45.7 % — ABNORMAL HIGH (ref 35.0–45.0)
Hemoglobin: 15.3 g/dL (ref 11.7–15.5)
MCH: 31.5 pg (ref 27.0–33.0)
MCHC: 33.5 g/dL (ref 32.0–36.0)
MCV: 94.2 fL (ref 80.0–100.0)
MPV: 8.9 fL (ref 7.5–12.5)
Platelets: 257 10*3/uL (ref 140–400)
RBC: 4.85 10*6/uL (ref 3.80–5.10)
RDW: 11.3 % (ref 11.0–15.0)
WBC: 4 10*3/uL (ref 3.8–10.8)

## 2020-01-27 ENCOUNTER — Ambulatory Visit (INDEPENDENT_AMBULATORY_CARE_PROVIDER_SITE_OTHER): Payer: BC Managed Care – PPO | Admitting: Family Medicine

## 2020-01-27 VITALS — BP 153/99 | HR 80 | Wt 132.0 lb

## 2020-01-27 DIAGNOSIS — R635 Abnormal weight gain: Secondary | ICD-10-CM

## 2020-01-27 DIAGNOSIS — R030 Elevated blood-pressure reading, without diagnosis of hypertension: Secondary | ICD-10-CM

## 2020-01-27 MED ORDER — PHENTERMINE HCL 37.5 MG PO CAPS: 37.5000 mg | ORAL_CAPSULE | ORAL | 0 refills | Status: DC

## 2020-01-27 NOTE — Progress Notes (Signed)
Established Patient Office Visit  Subjective:  Patient ID: Caitlyn Perez, female    DOB: 06/09/1975  Age: 44 y.o. MRN: [id number]  CC:  Chief Complaint  Patient presents with  . Weight Check    HPI Caitlyn Perez is here for blood pressure and weight check. Diet and exercise is going well. Denies trouble sleeping or palpitations.   Blood pressure elevated in office. Home readings are within normal limits.   Home readings - See MyChart message  Saturday:  115/74 and 127/81  Sunday:  122/79  Monday:  111/77   Past Medical History:  Diagnosis Date  . Cesarean delivery delivered 2010, 2011  . Umbilical hernia 1-60-10    Past Surgical History:  Procedure Laterality Date  . CESAREAN SECTION    . TUBAL LIGATION  BTL at time of 2nd cesarean section  . umbical hernia repair  11/2010    Family History  Problem Relation Age of Onset  . Lung cancer Maternal Grandmother 70       lung  . Diabetes Paternal Grandmother     Social History   Socioeconomic History  . Marital status: Married    Spouse name: Not on file  . Number of children: 2  . Years of education: Not on file  . Highest education level: Not on file  Occupational History  . Occupation: Pharmacist, hospital    Comment: [employer]  Tobacco Use  . Smoking status: Never Smoker  . Smokeless tobacco: Never Used  Vaping Use  . Vaping Use: Never used  Substance and Sexual Activity  . Alcohol use: Yes    Alcohol/week: 1.0 standard drink    Types: 1 Cans of beer per week    Comment: occassional  . Drug use: No  . Sexual activity: Yes    Partners: Female    Birth control/protection: None  Other Topics Concern  . Not on file  Social History Narrative   Regular exercise, 3-5 times per week.     Social Determinants of Health   Financial Resource Strain:   . Difficulty of Paying Living Expenses: Not on file  Food Insecurity:   . Worried About Charity fundraiser in the Last Year: Not on file  .  Ran Out of Food in the Last Year: Not on file  Transportation Needs:   . Lack of Transportation (Medical): Not on file  . Lack of Transportation (Non-Medical): Not on file  Physical Activity:   . Days of Exercise per Week: Not on file  . Minutes of Exercise per Session: Not on file  Stress:   . Feeling of Stress : Not on file  Social Connections:   . Frequency of Communication with Friends and Family: Not on file  . Frequency of Social Gatherings with Friends and Family: Not on file  . Attends Religious Services: Not on file  . Active Member of Clubs or Organizations: Not on file  . Attends Archivist Meetings: Not on file  . Marital Status: Not on file  Intimate Partner Violence:   . Fear of Current or Ex-Partner: Not on file  . Emotionally Abused: Not on file  . Physically Abused: Not on file  . Sexually Abused: Not on file    Outpatient Medications Prior to Visit  Medication Sig Dispense Refill  . BLISOVI 24 FE 1-20 MG-MCG(24) tablet TAKE 1 TABLET BY MOUTH EVERY DAY 84 tablet 4  . phentermine 37.5 MG capsule Take 1 capsule (37.5 mg total)  by mouth every morning. 30 capsule 0   No facility-administered medications prior to visit.    No Known Allergies  ROS Review of Systems    Objective:    Physical Exam  BP (!) 155/90   Pulse 80   Wt 132 lb (59.9 kg)   SpO2 100%   BMI 24.14 kg/m  Wt Readings from Last 3 Encounters:  01/27/20 132 lb (59.9 kg)  12/30/19 135 lb (61.2 kg)  01/14/19 135 lb (61.2 kg)     Health Maintenance Due  Topic Date Due  . Hepatitis C Screening  Never done  . INFLUENZA VACCINE  01/01/2020    There are no preventive care reminders to display for this patient.  Lab Results  Component Value Date   TSH 2.38 05/05/2016   Lab Results  Component Value Date   WBC 4.0 01/02/2020   HGB 15.3 01/02/2020   HCT 45.7 (H) 01/02/2020   MCV 94.2 01/02/2020   PLT 257 01/02/2020   Lab Results  Component Value Date   NA 138  01/02/2020   K 4.2 01/02/2020   CO2 27 01/02/2020   GLUCOSE 122 (H) 01/02/2020   BUN 13 01/02/2020   CREATININE 0.84 01/02/2020   BILITOT 0.6 01/02/2020   ALKPHOS 50 05/05/2016   AST 18 01/02/2020   ALT 20 01/02/2020   PROT 6.8 01/02/2020   ALBUMIN 4.3 05/05/2016   CALCIUM 9.1 01/02/2020   Lab Results  Component Value Date   CHOL 211 (H) 01/02/2020   Lab Results  Component Value Date   HDL 41 (L) 01/02/2020   Lab Results  Component Value Date   LDLCALC 147 (H) 01/02/2020   Lab Results  Component Value Date   TRIG 109 01/02/2020   Lab Results  Component Value Date   CHOLHDL 5.1 (H) 01/02/2020   No results found for: HGBA1C    Assessment & Plan:  Abnormal weight gain - Patient has lost weight. A refill on phentermine sent to pharmacy. Patient advised to schedule a follow up in 4 weeks. Patient advised to continue to monitor blood pressure readings at home.    Problem List Items Addressed This Visit    None    Visit Diagnoses    Abnormal weight gain    -  Primary      No orders of the defined types were placed in this encounter.   Follow-up: Return in about 4 weeks (around 02/24/2020) for blood pressure and weight check. Durene Romans, Monico Blitz, [place]

## 2020-01-27 NOTE — Progress Notes (Signed)
Follow-up abnormal weight gain-we will continue to monitor blood pressure carefully blood pressures at home seem to be really well controlled but are elevated here in the office.  She is otherwise tolerating medication well and she is down 3 additional pounds we will go ahead and refill medication.  Follow-up in 1 month  Beatrice Lecher, MD

## 2020-02-24 ENCOUNTER — Ambulatory Visit (INDEPENDENT_AMBULATORY_CARE_PROVIDER_SITE_OTHER): Payer: BC Managed Care – PPO | Admitting: Family Medicine

## 2020-02-24 ENCOUNTER — Other Ambulatory Visit: Payer: Self-pay

## 2020-02-24 VITALS — BP 141/95 | HR 81 | Wt 130.0 lb

## 2020-02-24 DIAGNOSIS — R635 Abnormal weight gain: Secondary | ICD-10-CM

## 2020-02-24 NOTE — Progress Notes (Signed)
Patient here for blood pressure and weight check. Denies trouble sleeping, palpitations or medication problems. Patient has lost weight (2) pounds. Patient advised to schedule follow up with nurse or provider in 30 days.

## 2020-02-25 ENCOUNTER — Encounter: Payer: Self-pay | Admitting: Family Medicine

## 2020-02-25 MED ORDER — PHENTERMINE HCL 37.5 MG PO CAPS: 37.5000 mg | ORAL_CAPSULE | ORAL | 0 refills | Status: DC

## 2020-02-25 NOTE — Progress Notes (Signed)
Agree with documentation as above.   Keundra Petrucelli, MD  

## 2020-03-23 ENCOUNTER — Ambulatory Visit: Payer: BC Managed Care – PPO

## 2020-03-29 ENCOUNTER — Ambulatory Visit (INDEPENDENT_AMBULATORY_CARE_PROVIDER_SITE_OTHER): Payer: BC Managed Care – PPO | Admitting: Family Medicine

## 2020-03-29 ENCOUNTER — Other Ambulatory Visit: Payer: Self-pay

## 2020-03-29 DIAGNOSIS — R635 Abnormal weight gain: Secondary | ICD-10-CM

## 2020-03-29 MED ORDER — PHENTERMINE HCL 37.5 MG PO CAPS: 37.5000 mg | ORAL_CAPSULE | ORAL | 0 refills | Status: DC

## 2020-03-29 NOTE — Progress Notes (Signed)
Abnormal weight gain-we will refill phentermine does need to follow-up with me next month.  BMI is now down to 23 so we can discuss starting to taper her medication at next visit.  Caitlyn Lecher, MD

## 2020-03-29 NOTE — Progress Notes (Signed)
   Subjective:    Patient ID: Caitlyn Perez, female    DOB: 1976-01-01, 44 y.o.   MRN: [id number]  HPI Patient is here for blood pressure and weight check. Denies trouble sleeping, palpitations, or medication problems.     Review of Systems     Objective:   Physical Exam        Assessment & Plan:  Patient has lost one pound since last visit. A refill for phentermine has been pended. Patient advised to schedule a follow up with provider in 30 days.  Blood pressure was elevated today at 138/84. She reports blood pressures are usually elevated in the office, so she brought a log of home readings (placed these in providers box to review). Home readings range from 109-130 over 72-84.

## 2020-04-25 ENCOUNTER — Other Ambulatory Visit: Payer: Self-pay

## 2020-04-25 ENCOUNTER — Ambulatory Visit (INDEPENDENT_AMBULATORY_CARE_PROVIDER_SITE_OTHER): Payer: BC Managed Care – PPO | Admitting: Family Medicine

## 2020-04-25 ENCOUNTER — Encounter: Payer: Self-pay | Admitting: Family Medicine

## 2020-04-25 VITALS — BP 131/86 | HR 94 | Ht 62.0 in | Wt 126.0 lb

## 2020-04-25 DIAGNOSIS — R635 Abnormal weight gain: Secondary | ICD-10-CM | POA: Diagnosis not present

## 2020-04-25 MED ORDER — PHENTERMINE HCL 37.5 MG PO CAPS: 37.5000 mg | ORAL_CAPSULE | ORAL | 0 refills | Status: DC

## 2020-04-25 NOTE — Progress Notes (Signed)
Established Patient Office Visit  Subjective:  Patient ID: Caitlyn Perez, female    DOB: 28-Jan-1976  Age: 44 y.o. MRN: [id number]  CC:  Chief Complaint  Patient presents with  . Weight Check    HPI Caitlyn Perez presents for abnormal weight gain.  She is actually doing really well.  On our scale she is down another 3 pounds.  She says her goal is to get down to about 120 pounds.  At home she is weighing about 123 on her home scale without clothing.  She is still working out regularly and is really sticking to her diet though she is planning on changing up her exercise routine to maybe add a little weight lifting and now that it is gotten colder she is working out in [employer] instead of running.  She denies any chest pain, shortness breath palpitations or insomnia on the phentermine.  Past Medical History:  Diagnosis Date  . Cesarean delivery delivered 2010, 2011  . Umbilical hernia 6-73-41    Past Surgical History:  Procedure Laterality Date  . CESAREAN SECTION    . TUBAL LIGATION  BTL at time of 2nd cesarean section  . umbical hernia repair  11/2010    Family History  Problem Relation Age of Onset  . Lung cancer Maternal Grandmother 70       lung  . Diabetes Paternal Grandmother     Social History   Socioeconomic History  . Marital status: Married    Spouse name: Not on file  . Number of children: 2  . Years of education: Not on file  . Highest education level: Not on file  Occupational History  . Occupation: Pharmacist, hospital    Comment: [employer]  Tobacco Use  . Smoking status: Never Smoker  . Smokeless tobacco: Never Used  Vaping Use  . Vaping Use: Never used  Substance and Sexual Activity  . Alcohol use: Yes    Alcohol/week: 1.0 standard drink    Types: 1 Cans of beer per week    Comment: occassional  . Drug use: No  . Sexual activity: Yes    Partners: Female    Birth control/protection: None  Other Topics Concern  . Not on file  Social  History Narrative   Regular exercise, 3-5 times per week.     Social Determinants of Health   Financial Resource Strain:   . Difficulty of Paying Living Expenses: Not on file  Food Insecurity:   . Worried About Charity fundraiser in the Last Year: Not on file  . Ran Out of Food in the Last Year: Not on file  Transportation Needs:   . Lack of Transportation (Medical): Not on file  . Lack of Transportation (Non-Medical): Not on file  Physical Activity:   . Days of Exercise per Week: Not on file  . Minutes of Exercise per Session: Not on file  Stress:   . Feeling of Stress : Not on file  Social Connections:   . Frequency of Communication with Friends and Family: Not on file  . Frequency of Social Gatherings with Friends and Family: Not on file  . Attends Religious Services: Not on file  . Active Member of Clubs or Organizations: Not on file  . Attends Archivist Meetings: Not on file  . Marital Status: Not on file  Intimate Partner Violence:   . Fear of Current or Ex-Partner: Not on file  . Emotionally Abused: Not on file  .  Physically Abused: Not on file  . Sexually Abused: Not on file    Outpatient Medications Prior to Visit  Medication Sig Dispense Refill  . BLISOVI 24 FE 1-20 MG-MCG(24) tablet TAKE 1 TABLET BY MOUTH EVERY DAY 84 tablet 4  . phentermine 37.5 MG capsule Take 1 capsule (37.5 mg total) by mouth every morning. 30 capsule 0   No facility-administered medications prior to visit.    No Known Allergies  ROS Review of Systems    Objective:    Physical Exam Constitutional:      Appearance: She is well-developed.  HENT:     Head: Normocephalic and atraumatic.  Cardiovascular:     Rate and Rhythm: Normal rate and regular rhythm.     Heart sounds: Normal heart sounds.  Pulmonary:     Effort: Pulmonary effort is normal.     Breath sounds: Normal breath sounds.  Skin:    General: Skin is warm and dry.  Neurological:     Mental Status: She is  alert and oriented to person, place, and time.  Psychiatric:        Behavior: Behavior normal.     BP 131/86   Pulse 94   Ht 5\' 2"  (1.575 m)   Wt 126 lb (57.2 kg)   SpO2 100%   BMI 23.05 kg/m  Wt Readings from Last 3 Encounters:  04/25/20 126 lb (57.2 kg)  03/29/20 129 lb (58.5 kg)  02/24/20 130 lb (59 kg)     Health Maintenance Due  Topic Date Due  . Hepatitis C Screening  Never done    There are no preventive care reminders to display for this patient.  Lab Results  Component Value Date   TSH 2.38 05/05/2016   Lab Results  Component Value Date   WBC 4.0 01/02/2020   HGB 15.3 01/02/2020   HCT 45.7 (H) 01/02/2020   MCV 94.2 01/02/2020   PLT 257 01/02/2020   Lab Results  Component Value Date   NA 138 01/02/2020   K 4.2 01/02/2020   CO2 27 01/02/2020   GLUCOSE 122 (H) 01/02/2020   BUN 13 01/02/2020   CREATININE 0.84 01/02/2020   BILITOT 0.6 01/02/2020   ALKPHOS 50 05/05/2016   AST 18 01/02/2020   ALT 20 01/02/2020   PROT 6.8 01/02/2020   ALBUMIN 4.3 05/05/2016   CALCIUM 9.1 01/02/2020   Lab Results  Component Value Date   CHOL 211 (H) 01/02/2020   Lab Results  Component Value Date   HDL 41 (L) 01/02/2020   Lab Results  Component Value Date   LDLCALC 147 (H) 01/02/2020   Lab Results  Component Value Date   TRIG 109 01/02/2020   Lab Results  Component Value Date   CHOLHDL 5.1 (H) 01/02/2020   No results found for: HGBA1C    Assessment & Plan:   Problem List Items Addressed This Visit    None    Visit Diagnoses    Abnormal weight gain    -  Primary   Relevant Medications   phentermine 37.5 MG capsule      Discussed options.  Again she would really like to get down to about 3 more pounds 220 pounds total.  We will refill phentermine for 37.5 mg for 1 more month and at that point plan will be to taper down to 15 mg daily for 1 month and then 15 mg every other day for a month.  If for some reason we need to do 1  additional month of the  higher dose, 37.5 mg, she can let me know in 1 month and send me a my chart note.  I think she is going to be extremely successful and has done a fantastic job in getting to her goal.  Meds ordered this encounter  Medications  . phentermine 37.5 MG capsule    Sig: Take 1 capsule (37.5 mg total) by mouth every morning.    Dispense:  30 capsule    Refill:  0    Follow-up: No follow-ups on file.    Beatrice Lecher, MD

## 2020-05-23 ENCOUNTER — Encounter: Payer: Self-pay | Admitting: Family Medicine

## 2020-05-24 ENCOUNTER — Telehealth: Payer: Self-pay | Admitting: Neurology

## 2020-05-24 ENCOUNTER — Other Ambulatory Visit: Payer: Self-pay | Admitting: Family Medicine

## 2020-05-24 DIAGNOSIS — R635 Abnormal weight gain: Secondary | ICD-10-CM

## 2020-05-24 MED ORDER — PHENTERMINE HCL 37.5 MG PO CAPS: 37.5000 mg | ORAL_CAPSULE | ORAL | 0 refills | Status: DC

## 2020-05-24 NOTE — Telephone Encounter (Signed)
Prior Authorization for Phentermine submitted via covermymeds. Awaiting response.

## 2020-06-28 ENCOUNTER — Encounter: Payer: Self-pay | Admitting: Family Medicine

## 2020-06-28 DIAGNOSIS — R635 Abnormal weight gain: Secondary | ICD-10-CM

## 2020-06-28 MED ORDER — PHENTERMINE HCL 15 MG PO CAPS: 15.0000 mg | ORAL_CAPSULE | ORAL | 0 refills | Status: DC

## 2020-12-06 ENCOUNTER — Other Ambulatory Visit: Payer: Self-pay | Admitting: Family Medicine

## 2020-12-06 DIAGNOSIS — Z1231 Encounter for screening mammogram for malignant neoplasm of breast: Secondary | ICD-10-CM

## 2020-12-27 ENCOUNTER — Ambulatory Visit (INDEPENDENT_AMBULATORY_CARE_PROVIDER_SITE_OTHER): Payer: BC Managed Care – PPO

## 2020-12-27 ENCOUNTER — Other Ambulatory Visit: Payer: Self-pay

## 2020-12-27 DIAGNOSIS — Z1231 Encounter for screening mammogram for malignant neoplasm of breast: Secondary | ICD-10-CM | POA: Diagnosis not present

## 2021-01-10 ENCOUNTER — Encounter: Payer: Self-pay | Admitting: Family Medicine

## 2021-01-10 DIAGNOSIS — R739 Hyperglycemia, unspecified: Secondary | ICD-10-CM

## 2021-01-10 DIAGNOSIS — E7800 Pure hypercholesterolemia, unspecified: Secondary | ICD-10-CM

## 2021-01-10 DIAGNOSIS — Z0000 Encounter for general adult medical examination without abnormal findings: Secondary | ICD-10-CM

## 2021-01-14 ENCOUNTER — Encounter: Payer: Self-pay | Admitting: Family Medicine

## 2021-01-14 ENCOUNTER — Ambulatory Visit (INDEPENDENT_AMBULATORY_CARE_PROVIDER_SITE_OTHER): Payer: BC Managed Care – PPO | Admitting: Family Medicine

## 2021-01-14 ENCOUNTER — Other Ambulatory Visit: Payer: Self-pay

## 2021-01-14 VITALS — BP 149/90 | HR 73 | Wt 131.1 lb

## 2021-01-14 DIAGNOSIS — R739 Hyperglycemia, unspecified: Secondary | ICD-10-CM

## 2021-01-14 DIAGNOSIS — G43009 Migraine without aura, not intractable, without status migrainosus: Secondary | ICD-10-CM | POA: Diagnosis not present

## 2021-01-14 DIAGNOSIS — R030 Elevated blood-pressure reading, without diagnosis of hypertension: Secondary | ICD-10-CM

## 2021-01-14 DIAGNOSIS — Z0000 Encounter for general adult medical examination without abnormal findings: Secondary | ICD-10-CM

## 2021-01-14 DIAGNOSIS — E7800 Pure hypercholesterolemia, unspecified: Secondary | ICD-10-CM | POA: Diagnosis not present

## 2021-01-14 MED ORDER — BLISOVI 24 FE 1-20 MG-MCG(24) PO TABS: 1.0000 | ORAL_TABLET | Freq: Every day | ORAL | 4 refills | Status: DC

## 2021-01-14 MED ORDER — UBRELVY 50 MG PO TABS: 1.0000 | ORAL_TABLET | Freq: Every day | ORAL | 12 refills | Status: DC | PRN

## 2021-01-14 NOTE — Progress Notes (Signed)
Subjective:     Caitlyn Perez is a 45 y.o. female and is here for a comprehensive physical exam. The patient reports problems - Headaches .  She says now that she is hit menopause she has been having more intense headaches that also last longer sometimes days.  She feels like they are more consistent with headaches and they are tension headaches.  She does not get any light or sound sensitivity but sometimes after about 3 days of persistent headache will just have to lay down and rest to get it to go away she will typically try to drink a small Coke and maybe take an over-the-counter pain reliever sometimes that works.  But more recently she has been taking some samples of you Ubrevly and that actually has been helpful.  She is also tried Nurtec ODT.  She said more recently she is also been taking some CBD Gummies and that has actually been helping her sleep quality she is also noticed he has not been having as many headaches since taking the Gummies as well.  Social History   Socioeconomic History   Marital status: Married    Spouse name: Not on file   Number of children: 2   Years of education: Not on file   Highest education level: Not on file  Occupational History   Occupation: Pharmacist, hospital    Comment: [employer]  Tobacco Use   Smoking status: Never   Smokeless tobacco: Never  Vaping Use   Vaping Use: Never used  Substance and Sexual Activity   Alcohol use: Yes    Alcohol/week: 1.0 standard drink    Types: 1 Cans of beer per week    Comment: occassional   Drug use: No   Sexual activity: Yes    Partners: Female    Birth control/protection: None  Other Topics Concern   Not on file  Social History Narrative   Regular exercise, 3-5 times per week.     Social Determinants of Health   Financial Resource Strain: Not on file  Food Insecurity: Not on file  Transportation Needs: Not on file  Physical Activity: Not on file  Stress: Not on file  Social Connections: Not on  file  Intimate Partner Violence: Not on file   Health Maintenance  Topic Date Due   Hepatitis C Screening  Never done   COVID-19 Vaccine (3 - Booster for Pfizer series) 01/24/2020   INFLUENZA VACCINE  12/31/2020   TETANUS/TDAP  12/24/2021   PAP SMEAR-Modifier  11/30/2023   HIV Screening  Completed   Pneumococcal Vaccine 62-48 Years old  Aged Out   HPV VACCINES  Aged Out    The following portions of the patient's history were reviewed and updated as appropriate: allergies, current medications, past family history, past medical history, past social history, past surgical history, and problem list.  Review of Systems Pertinent items are noted in HPI.     Objective:    BP (!) 149/90 (BP Location: Left Arm, Patient Position: Sitting, Cuff Size: Normal)   Pulse 73   Wt 131 lb 1.3 oz (59.5 kg)   SpO2 100%   BMI 23.97 kg/m  General appearance: alert, cooperative, and appears stated age Head: Normocephalic, without obvious abnormality, atraumatic Eyes: conjunctivae/corneas clear. PERRL, EOM's intact. Fundi benign. Ears: normal TM's and external ear canals both ears Nose: Nares normal. Septum midline. Mucosa normal. No drainage or sinus tenderness. Throat: lips, mucosa, and tongue normal; teeth and gums normal Neck: no adenopathy, no  carotid bruit, no JVD, supple, symmetrical, trachea midline, and thyroid not enlarged, symmetric, no tenderness/mass/nodules Back: symmetric, no curvature. ROM normal. No CVA tenderness. Lungs: clear to auscultation bilaterally Heart: regular rate and rhythm, S1, S2 normal, no murmur, click, rub or gallop Abdomen: soft, non-tender; bowel sounds normal; no masses,  no organomegaly Extremities: extremities normal, atraumatic, no cyanosis or edema Pulses: 2+ and symmetric Skin: Skin color, texture, turgor normal. No rashes or lesions Lymph nodes: Cervical adenopathy: nl and Supraclavicular adenopathy: nl Neurologic: Grossly normal    Assessment:     Healthy female exam.      Plan:     See After Visit Summary for Counseling Recommendations  Keep up a regular exercise program and make sure you are eating a healthy diet Try to eat 4 servings of dairy a day, or if you are lactose intolerant take a calcium with vitamin D daily.  Your vaccines are up to date.   Migraine without aura, not intractable New diagnosis of migraine without aura.  Discussed potential triggers including caffeine, poor sleep quality, stress etc.  She really stays away from a lot of the trigger foods including nuts and wine and aged cheeses etc.  We will write a prescription for Roselyn Meier since that seems to be working well for her.  Though her insurance may make her try a triptan first so just made her aware of that did give her a coupon card today.  Keep track of how often having migraines.  She has had some improvement since being on the CBD gummy supplements.  I suspect that the improvement in her sleep quality has resulted in improvement in her headaches.  Elevated blood pressure-recommend keeping an eye on blood pressure at home.  Blood pressure last fall looked beautiful.

## 2021-01-14 NOTE — Assessment & Plan Note (Signed)
New diagnosis of migraine without aura.  Discussed potential triggers including caffeine, poor sleep quality, stress etc.  She really stays away from a lot of the trigger foods including nuts and wine and aged cheeses etc.  We will write a prescription for Roselyn Meier since that seems to be working well for her.  Though her insurance may make her try a triptan first so just made her aware of that did give her a coupon card today.  Keep track of how often having migraines.  She has had some improvement since being on the CBD gummy supplements.  I suspect that the improvement in her sleep quality has resulted in improvement in her headaches.

## 2021-01-15 LAB — CBC WITH DIFFERENTIAL/PLATELET
Absolute Monocytes: 299 cells/uL (ref 200–950)
Basophils Absolute: 18 cells/uL (ref 0–200)
Basophils Relative: 0.3 %
Eosinophils Absolute: 159 cells/uL (ref 15–500)
Eosinophils Relative: 2.6 %
HCT: 45.8 % — ABNORMAL HIGH (ref 35.0–45.0)
Hemoglobin: 15.4 g/dL (ref 11.7–15.5)
Lymphs Abs: 1739 cells/uL (ref 850–3900)
MCH: 32 pg (ref 27.0–33.0)
MCHC: 33.6 g/dL (ref 32.0–36.0)
MCV: 95 fL (ref 80.0–100.0)
MPV: 9.1 fL (ref 7.5–12.5)
Monocytes Relative: 4.9 %
Neutro Abs: 3886 cells/uL (ref 1500–7800)
Neutrophils Relative %: 63.7 %
Platelets: 297 10*3/uL (ref 140–400)
RBC: 4.82 10*6/uL (ref 3.80–5.10)
RDW: 11.5 % (ref 11.0–15.0)
Total Lymphocyte: 28.5 %
WBC: 6.1 10*3/uL (ref 3.8–10.8)

## 2021-01-15 LAB — COMPLETE METABOLIC PANEL WITH GFR
AG Ratio: 1.8 (calc) (ref 1.0–2.5)
ALT: 9 U/L (ref 6–29)
AST: 10 U/L (ref 10–30)
Albumin: 4.4 g/dL (ref 3.6–5.1)
Alkaline phosphatase (APISO): 39 U/L (ref 31–125)
BUN: 13 mg/dL (ref 7–25)
CO2: 25 mmol/L (ref 20–32)
Calcium: 9.3 mg/dL (ref 8.6–10.2)
Chloride: 106 mmol/L (ref 98–110)
Creat: 0.76 mg/dL (ref 0.50–0.99)
Globulin: 2.5 g/dL (calc) (ref 1.9–3.7)
Glucose, Bld: 89 mg/dL (ref 65–99)
Potassium: 4.3 mmol/L (ref 3.5–5.3)
Sodium: 140 mmol/L (ref 135–146)
Total Bilirubin: 0.5 mg/dL (ref 0.2–1.2)
Total Protein: 6.9 g/dL (ref 6.1–8.1)
eGFR: 99 mL/min/{1.73_m2} (ref >=60)

## 2021-01-15 LAB — TSH: TSH: 1.74 mIU/L

## 2021-01-15 LAB — HEMOGLOBIN A1C
Hgb A1c MFr Bld: 4.8 % of total Hgb (ref <5.7)
Mean Plasma Glucose: 91 mg/dL
eAG (mmol/L): 5 mmol/L

## 2021-01-15 LAB — LIPID PANEL
Cholesterol: 221 mg/dL — ABNORMAL HIGH (ref <200)
HDL: 47 mg/dL — ABNORMAL LOW (ref >=50)
LDL Cholesterol (Calc): 147 mg/dL (calc) — ABNORMAL HIGH
Non-HDL Cholesterol (Calc): 174 mg/dL (calc) — ABNORMAL HIGH (ref <130)
Total CHOL/HDL Ratio: 4.7 (calc) (ref <5.0)
Triglycerides: 142 mg/dL (ref <150)

## 2021-01-28 ENCOUNTER — Encounter: Payer: Self-pay | Admitting: Family Medicine

## 2021-01-29 NOTE — Telephone Encounter (Signed)
Please send information to [facility] and notify patient when done.

## 2021-02-05 ENCOUNTER — Encounter: Payer: Self-pay | Admitting: Family Medicine

## 2021-02-05 DIAGNOSIS — G43009 Migraine without aura, not intractable, without status migrainosus: Secondary | ICD-10-CM

## 2021-02-05 MED ORDER — RIZATRIPTAN BENZOATE 10 MG PO TBDP: 10.0000 mg | ORAL_TABLET | ORAL | 1 refills | Status: DC | PRN

## 2021-02-05 NOTE — Telephone Encounter (Signed)
Lets try maxalt first.   Meds ordered this encounter  Medications   rizatriptan (MAXALT-MLT) 10 MG disintegrating tablet    Sig: Take 1 tablet (10 mg total) by mouth as needed for migraine. May repeat in 2 hours if needed    Dispense:  10 tablet    Refill:  1

## 2021-02-21 ENCOUNTER — Telehealth: Payer: Self-pay

## 2021-02-21 NOTE — Telephone Encounter (Signed)
Medication: Ubrelvy 50 mg Prior authorization submitted via CoverMyMeds on 02/21/2021 PA submission pending

## 2021-02-26 NOTE — Telephone Encounter (Signed)
Medication: Ubrelvy 50 mg Prior authorization determination received Medication has been approved Approval dates: 02/21/2021-02/21/2022  Patient aware via: [place] aware: Yes Provider aware via this encounter

## 2021-08-30 ENCOUNTER — Encounter: Payer: Self-pay | Admitting: Family Medicine

## 2021-11-17 IMAGING — MG MM DIGITAL SCREENING BILAT W/ TOMO AND CAD
8 series · 9 of 24 positions shown · non-contrast
Comparison: Previous exam(s).

CLINICAL DATA: Screening.

EXAM:
DIGITAL SCREENING BILATERAL MAMMOGRAM WITH TOMOSYNTHESIS AND CAD
TECHNIQUE: Bilateral screening digital craniocaudal and mediolateral oblique
mammograms were obtained. Bilateral screening digital breast
tomosynthesis was performed. The images were evaluated with
computer-aided detection.

[R MLO synth-2D]
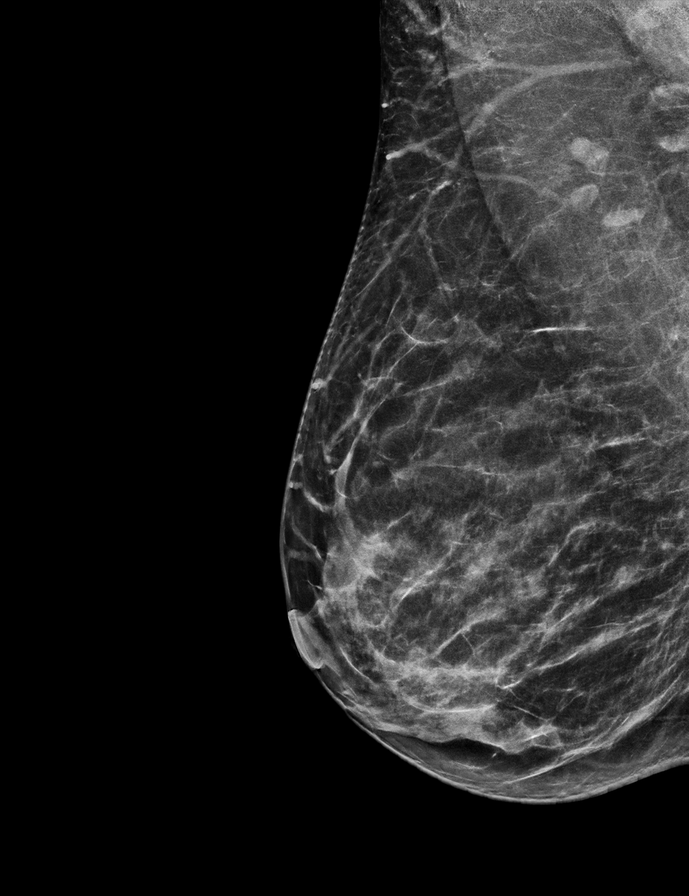

[R CC synth-2D]
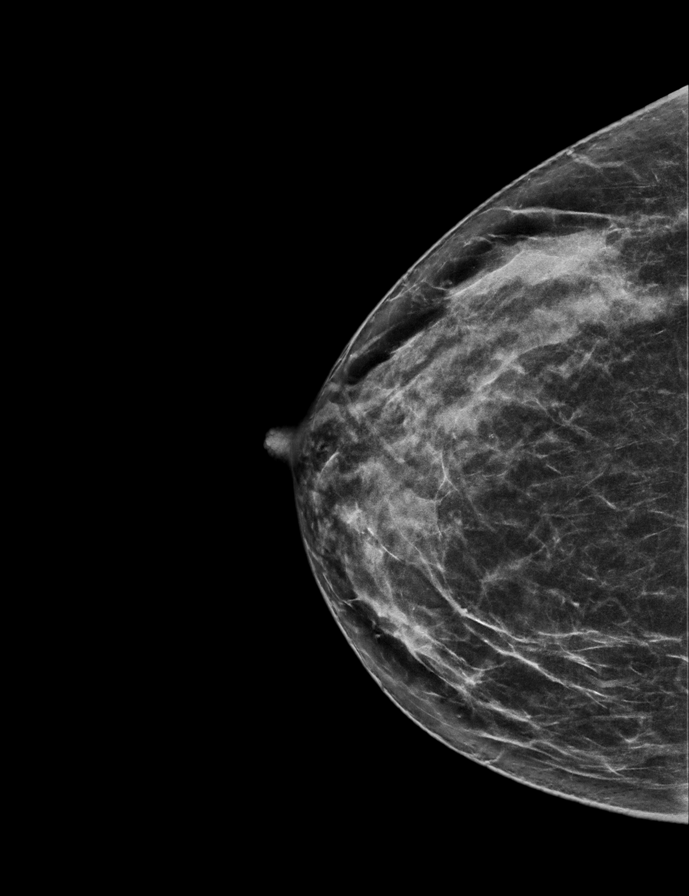

[L CC synth-2D]
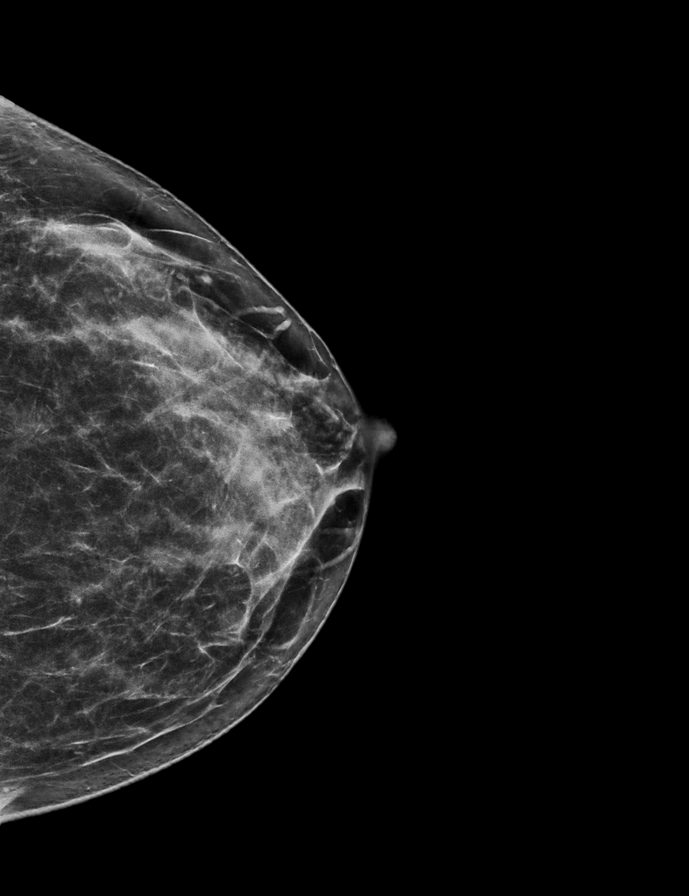

[L MLO synth-2D]
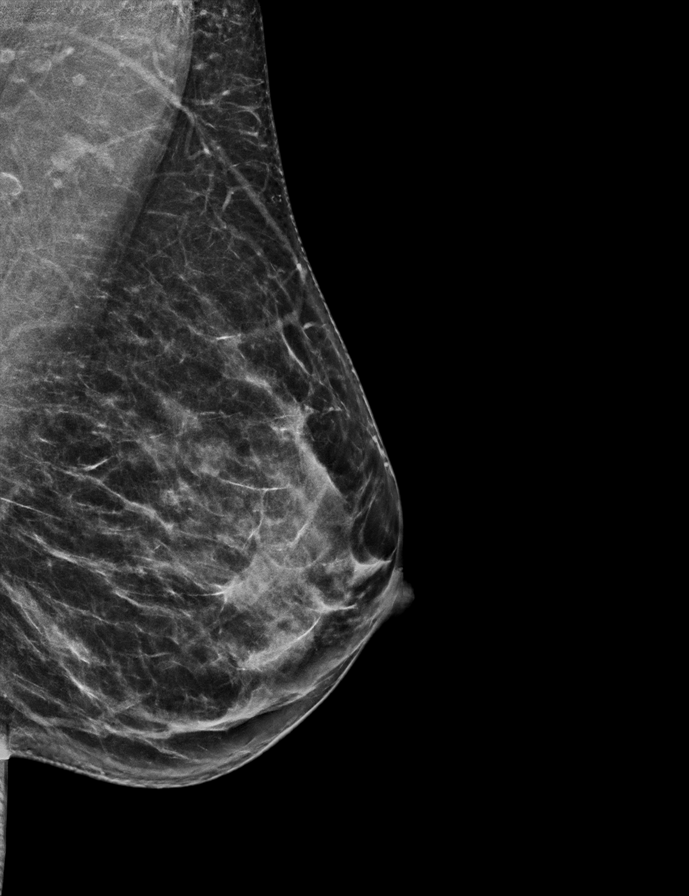

[R CC tomo · 2 of 59 frames shown]
[frame 20/59]
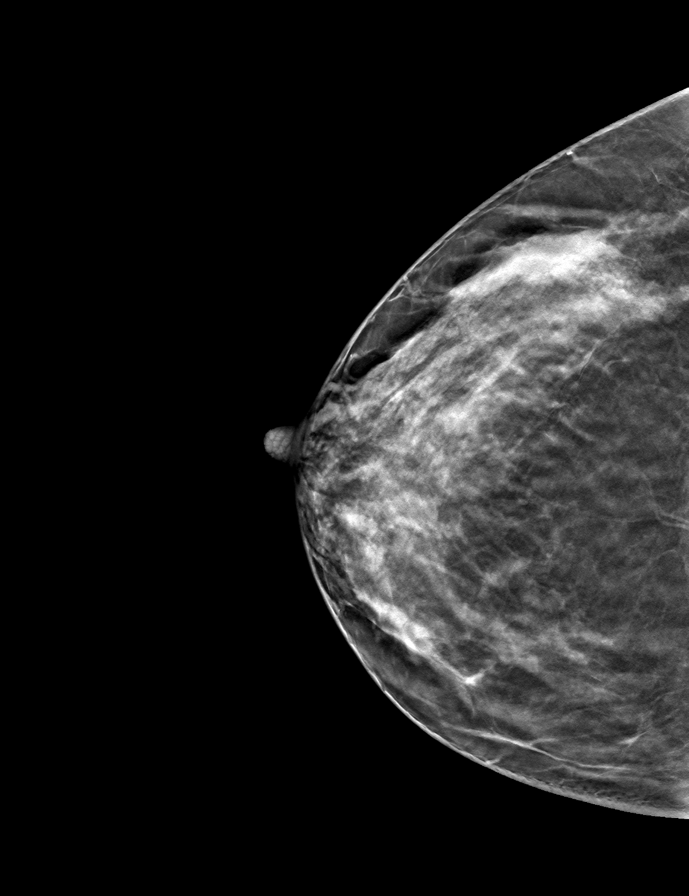
[frame 30/59]
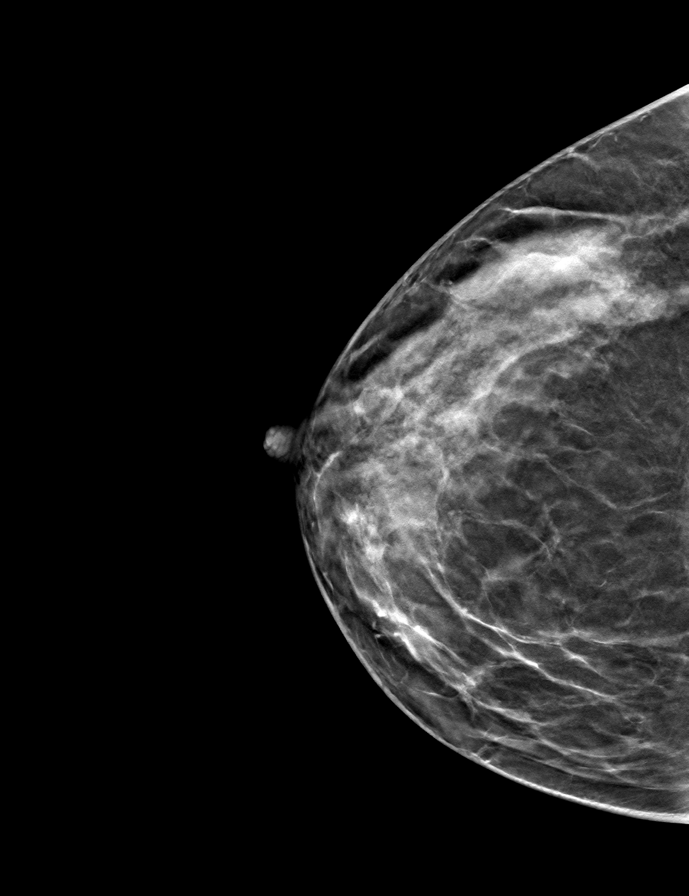

[L CC tomo · tomo slice 31/60.0]
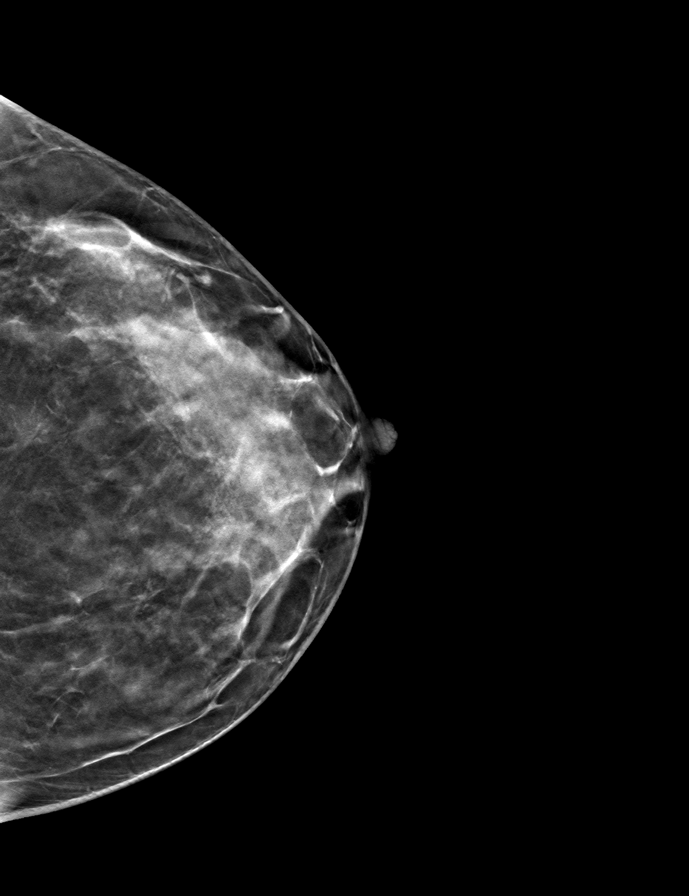

[R MLO tomo · tomo slice 31/62.0]
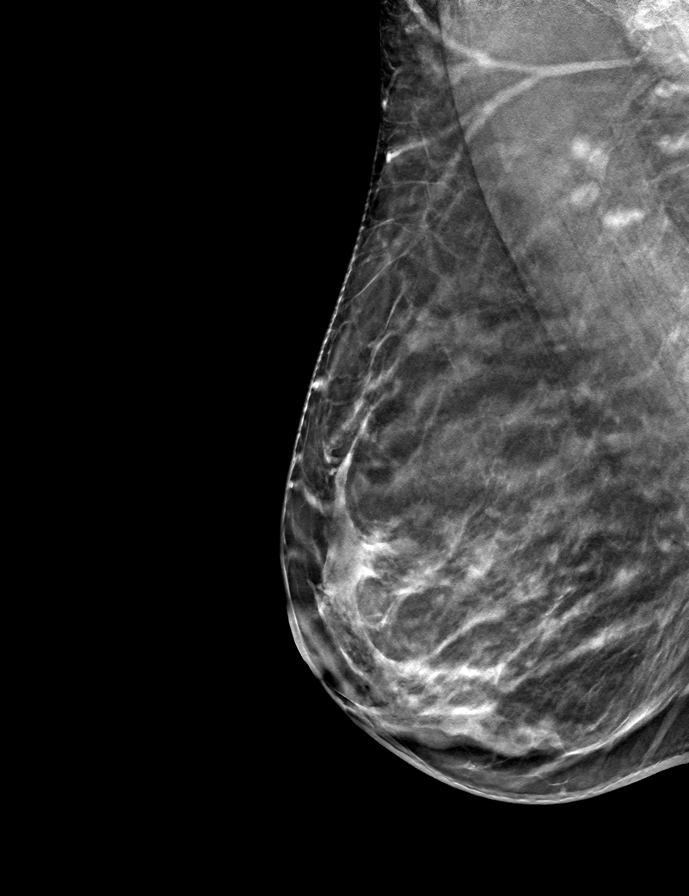

[L MLO tomo · tomo slice 32/63.0]
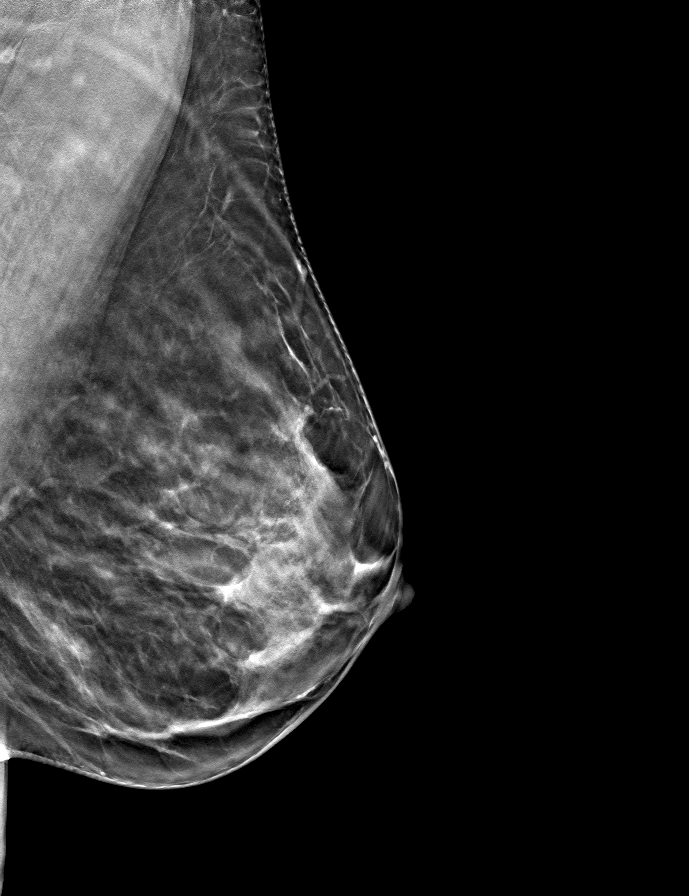

[9 of 24 positions shown; findings below may reference images not displayed]

ACR Breast Density Category c: The breast tissue is heterogeneously
dense, which may obscure small masses.
FINDINGS: There are no findings suspicious for malignancy.
IMPRESSION: No mammographic evidence of malignancy. A result letter of this
screening mammogram will be mailed directly to the patient.

RECOMMENDATION:
Screening mammogram in one year. (Code:[id number])

BI-RADS CATEGORY  1: Negative.

## 2022-02-25 ENCOUNTER — Other Ambulatory Visit: Payer: Self-pay | Admitting: Family Medicine

## 2022-02-25 DIAGNOSIS — Z1231 Encounter for screening mammogram for malignant neoplasm of breast: Secondary | ICD-10-CM

## 2022-03-01 ENCOUNTER — Other Ambulatory Visit: Payer: Self-pay | Admitting: Family Medicine

## 2022-03-19 ENCOUNTER — Ambulatory Visit (INDEPENDENT_AMBULATORY_CARE_PROVIDER_SITE_OTHER): Payer: BC Managed Care – PPO

## 2022-03-19 ENCOUNTER — Encounter: Payer: Self-pay | Admitting: Family Medicine

## 2022-03-19 ENCOUNTER — Ambulatory Visit (INDEPENDENT_AMBULATORY_CARE_PROVIDER_SITE_OTHER): Payer: BC Managed Care – PPO | Admitting: Family Medicine

## 2022-03-19 VITALS — BP 127/71 | HR 70 | Ht 62.0 in | Wt 123.0 lb

## 2022-03-19 DIAGNOSIS — Z1231 Encounter for screening mammogram for malignant neoplasm of breast: Secondary | ICD-10-CM | POA: Diagnosis not present

## 2022-03-19 DIAGNOSIS — Z0000 Encounter for general adult medical examination without abnormal findings: Secondary | ICD-10-CM

## 2022-03-19 DIAGNOSIS — Z1159 Encounter for screening for other viral diseases: Secondary | ICD-10-CM | POA: Diagnosis not present

## 2022-03-19 DIAGNOSIS — Z23 Encounter for immunization: Secondary | ICD-10-CM

## 2022-03-19 DIAGNOSIS — G43009 Migraine without aura, not intractable, without status migrainosus: Secondary | ICD-10-CM

## 2022-03-19 DIAGNOSIS — Z1211 Encounter for screening for malignant neoplasm of colon: Secondary | ICD-10-CM | POA: Diagnosis not present

## 2022-03-19 MED ORDER — RIZATRIPTAN BENZOATE 10 MG PO TBDP: 10.0000 mg | ORAL_TABLET | ORAL | 5 refills | Status: DC | PRN

## 2022-03-19 NOTE — Progress Notes (Signed)
Complete physical exam  Patient: Caitlyn Perez   DOB: 02-09-1976   46 y.o. Female  MRN: [id number]  Subjective:    Chief Complaint  Patient presents with   Annual Exam    Caitlyn Perez is a 46 y.o. female who presents today for a complete physical exam. She reports consuming a general diet.  Exercises very regularly most days of the week.  She generally feels well. She reports sleeping well. She does not have additional problems to discuss today.  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  She does need refills on her Maxalt.   Most recent fall risk assessment:    03/19/2022    8:28 AM  [place] in the past year? 0  Number falls in past yr: 0  Injury with Fall? 0  Risk for fall due to : No Fall Risks  Follow up Falls evaluation completed     Most recent depression screenings:    03/19/2022    8:28 AM 01/14/2021    9:08 AM  PHQ 2/9 Scores  PHQ - 2 Score 0 0  PHQ- 9 Score 0         Patient Care Team: Hali Marry, MD as PCP - General (Family Medicine)   Outpatient Medications Prior to Visit  Medication Sig   HAILEY 24 FE 1-20 MG-MCG(24) tablet TAKE 1 TABLET BY MOUTH EVERY DAY   [DISCONTINUED] rizatriptan (MAXALT-MLT) 10 MG disintegrating tablet Take 1 tablet (10 mg total) by mouth as needed for migraine. May repeat in 2 hours if needed   No facility-administered medications prior to visit.    ROS        Objective:     BP 127/71   Pulse 70   Ht '5\' 2"'$  (1.575 m)   Wt 123 lb (55.8 kg)   SpO2 99%   BMI 22.50 kg/m    Physical Exam Constitutional:      Appearance: She is well-developed.  HENT:     Head: Normocephalic and atraumatic.     Right Ear:  Tympanic membrane, ear canal and external ear normal.     Left Ear: Tympanic membrane, ear canal and external ear normal.     Nose: Nose normal.  Eyes:     Conjunctiva/sclera: Conjunctivae normal.     Pupils: Pupils are equal, round, and reactive to light.  Neck:     Thyroid: No thyromegaly.  Cardiovascular:     Rate and Rhythm: Normal rate and regular rhythm.     Heart sounds: Normal heart sounds.  Pulmonary:     Effort: Pulmonary effort is normal.     Breath sounds: Normal breath sounds. No wheezing.  Abdominal:     General: Bowel sounds are normal.     Palpations: Abdomen is soft.  Musculoskeletal:     Cervical back: Neck supple.  Lymphadenopathy:     Cervical: No cervical adenopathy.  Skin:    General: Skin is warm and dry.  Neurological:     General: No focal deficit present.     Mental Status: She is alert and oriented to person, place, and time.  Psychiatric:  Mood and Affect: Mood normal.      No results found for any visits on 03/19/22.     Assessment & Plan:    Routine Health Maintenance and Physical Exam  Immunization History  Administered Date(s) Administered   Influenza,inj,Quad PF,6+ Mos 04/30/2017   PFIZER(Purple Top)SARS-COV-2 Vaccination 08/03/2019, 08/24/2019   PPD Test 12/26/2011, 01/12/2019   Tdap 12/25/2011, 03/19/2022    Health Maintenance  Topic Date Due   Hepatitis C Screening  Never done   COLONOSCOPY (Pts 45-75yr Insurance coverage will need to be confirmed)  Never done   INFLUENZA VACCINE  08/31/2022 (Originally 12/31/2021)   COVID-19 Vaccine (3 - Pfizer series) 03/20/2023 (Originally 10/19/2019)   PAP SMEAR-Modifier  11/30/2023   TETANUS/TDAP  03/19/2032   HIV Screening  Completed   Pneumococcal Vaccine [id number] old  Aged Out   HPV VACCINES  Aged Out    Discussed health benefits of physical activity, and encouraged her to engage in regular exercise appropriate for her age and condition.  Problem List Items Addressed This  Visit       Cardiovascular and Mediastinum   Migraine without aura, not intractable   Relevant Medications   rizatriptan (MAXALT-MLT) 10 MG disintegrating tablet   Other Visit Diagnoses     Routine general medical examination at a health care facility    -  Primary   Relevant Orders   Lipid panel   TSH   COMPLETE METABOLIC PANEL WITH GFR   CBC with Differential   HgB A1c   Hepatitis C Antibody   Screening for malignant neoplasm of colon       Relevant Orders   Cologuard   Need for tetanus, diphtheria, and acellular pertussis (Tdap) vaccine in patient of adolescent age or older       Relevant Orders   Tdap vaccine greater than or equal to 7yo IM (Completed)   Need for hepatitis C screening test       Relevant Orders   Hepatitis C Antibody       Keep up a regular exercise program and make sure you are eating a healthy diet Try to eat 4 servings of dairy a day, or if you are lactose intolerant take a calcium with vitamin D daily.  Your vaccines are up to date.  Okay to go ahead and discontinue her birth control.  If she feels like she is getting more symptoms and would like to restart it she can always let me know.  I will go ahead and leave it on the medication list for now but when I see her back if she is completely off then we will discontinue it.  We did discuss pros and cons of coming off the medication now. Digital for her mammogram today.   No follow-ups on file.     CBeatrice Lecher MD

## 2022-03-20 LAB — CBC WITH DIFFERENTIAL/PLATELET
Absolute Monocytes: 269 cells/uL (ref 200–950)
Basophils Absolute: 22 cells/uL (ref 0–200)
Basophils Relative: 0.4 %
Eosinophils Absolute: 112 cells/uL (ref 15–500)
Eosinophils Relative: 2 %
HCT: 45.4 % — ABNORMAL HIGH (ref 35.0–45.0)
Hemoglobin: 15.5 g/dL (ref 11.7–15.5)
Lymphs Abs: 1534 cells/uL (ref 850–3900)
MCH: 32.6 pg (ref 27.0–33.0)
MCHC: 34.1 g/dL (ref 32.0–36.0)
MCV: 95.4 fL (ref 80.0–100.0)
MPV: 9.3 fL (ref 7.5–12.5)
Monocytes Relative: 4.8 %
Neutro Abs: 3662 cells/uL (ref 1500–7800)
Neutrophils Relative %: 65.4 %
Platelets: 268 10*3/uL (ref 140–400)
RBC: 4.76 10*6/uL (ref 3.80–5.10)
RDW: 11.1 % (ref 11.0–15.0)
Total Lymphocyte: 27.4 %
WBC: 5.6 10*3/uL (ref 3.8–10.8)

## 2022-03-20 LAB — COMPLETE METABOLIC PANEL WITH GFR
AG Ratio: 1.8 (calc) (ref 1.0–2.5)
ALT: 11 U/L (ref 6–29)
AST: 13 U/L (ref 10–35)
Albumin: 4.6 g/dL (ref 3.6–5.1)
Alkaline phosphatase (APISO): 41 U/L (ref 31–125)
BUN: 19 mg/dL (ref 7–25)
CO2: 26 mmol/L (ref 20–32)
Calcium: 9.5 mg/dL (ref 8.6–10.2)
Chloride: 105 mmol/L (ref 98–110)
Creat: 0.77 mg/dL (ref 0.50–0.99)
Globulin: 2.6 g/dL (calc) (ref 1.9–3.7)
Glucose, Bld: 97 mg/dL (ref 65–99)
Potassium: 4.4 mmol/L (ref 3.5–5.3)
Sodium: 139 mmol/L (ref 135–146)
Total Bilirubin: 0.5 mg/dL (ref 0.2–1.2)
Total Protein: 7.2 g/dL (ref 6.1–8.1)
eGFR: 97 mL/min/{1.73_m2} (ref >=60)

## 2022-03-20 LAB — LIPID PANEL
Cholesterol: 213 mg/dL — ABNORMAL HIGH (ref <200)
HDL: 43 mg/dL — ABNORMAL LOW (ref >=50)
LDL Cholesterol (Calc): 144 mg/dL (calc) — ABNORMAL HIGH
Non-HDL Cholesterol (Calc): 170 mg/dL (calc) — ABNORMAL HIGH (ref <130)
Total CHOL/HDL Ratio: 5 (calc) — ABNORMAL HIGH (ref <5.0)
Triglycerides: 131 mg/dL (ref <150)

## 2022-03-20 LAB — HEMOGLOBIN A1C
Hgb A1c MFr Bld: 5 % of total Hgb (ref <5.7)
Mean Plasma Glucose: 97 mg/dL
eAG (mmol/L): 5.4 mmol/L

## 2022-03-20 LAB — TSH: TSH: 1.99 mIU/L

## 2022-03-20 LAB — HEPATITIS C ANTIBODY: Hepatitis C Ab: NONREACTIVE

## 2022-03-20 NOTE — Progress Notes (Signed)
Hi Milagros, your LDL is still similar to what its been over the last 4 years.  I know that must be frustrating.  I think it must be genetic and you may just make more natural cholesterol.  Your thyroid and blood count look great.  Your complete metabolic panel looks great as well.  A1c is normal no sign of diabetes or prediabetes.

## 2022-03-21 ENCOUNTER — Other Ambulatory Visit: Payer: Self-pay | Admitting: Family Medicine

## 2022-03-21 DIAGNOSIS — R928 Other abnormal and inconclusive findings on diagnostic imaging of breast: Secondary | ICD-10-CM

## 2022-03-21 NOTE — Progress Notes (Signed)
HI Caitlyn Perez, the mammo showed a suspicious area on your right breast. The imaging dep will call you to schedule further diagnostic imaging.

## 2022-04-02 LAB — COLOGUARD: COLOGUARD: NEGATIVE

## 2022-04-03 ENCOUNTER — Ambulatory Visit
Admission: RE | Admit: 2022-04-03 | Discharge: 2022-04-03 | Disposition: A | Discharge status: Home / Self Care | Payer: BC Managed Care – PPO | Source: Ambulatory Visit | Attending: Family Medicine | Admitting: Family Medicine

## 2022-04-03 DIAGNOSIS — R928 Other abnormal and inconclusive findings on diagnostic imaging of breast: Secondary | ICD-10-CM

## 2022-04-03 NOTE — Progress Notes (Signed)
Great news! Your Cologuard test is negative.  Recommend repeat colon cancer screening in 3 years.

## 2022-04-13 ENCOUNTER — Encounter: Payer: Self-pay | Admitting: Family Medicine

## 2022-04-13 DIAGNOSIS — N950 Postmenopausal bleeding: Secondary | ICD-10-CM

## 2022-04-15 NOTE — Telephone Encounter (Signed)
Attempted call to patient at [phone] . I think her message means to read - she is fine with you being in Charge but want to verify that this is correct before sending on to Dr. Madilyn Fireman. Left a voice mail to have patient return our call.

## 2022-04-16 NOTE — Telephone Encounter (Signed)
Orders Placed This Encounter  Procedures   US Pelvic Complete With Transvaginal    Standing Status:   Future    Standing Expiration Date:   04/17/2023    Order Specific Question:   Reason for Exam (SYMPTOM  OR DIAGNOSIS REQUIRED)    Answer:   pos meno bleeding    Order Specific Question:   Preferred imaging location?    Answer:   [facility]

## 2022-04-21 ENCOUNTER — Ambulatory Visit (INDEPENDENT_AMBULATORY_CARE_PROVIDER_SITE_OTHER): Payer: BC Managed Care – PPO

## 2022-04-21 DIAGNOSIS — N950 Postmenopausal bleeding: Secondary | ICD-10-CM | POA: Diagnosis not present

## 2022-04-23 ENCOUNTER — Encounter: Payer: Self-pay | Admitting: Family Medicine

## 2022-04-23 DIAGNOSIS — N950 Postmenopausal bleeding: Secondary | ICD-10-CM

## 2022-04-23 NOTE — Progress Notes (Signed)
Hi Tekia, The thickness of the lining is a little bit more than we would expect in a woman who is postmenopausal.  Typically it is good to be less than 4 to 5 cm.  So they have recommended further work-up with possible endometrial sampling.  Would you like me to get you back in with Dr. Gala Romney?

## 2022-05-19 ENCOUNTER — Ambulatory Visit: Payer: BC Managed Care – PPO | Admitting: Obstetrics and Gynecology

## 2022-05-19 ENCOUNTER — Encounter: Payer: Self-pay | Admitting: Obstetrics and Gynecology

## 2022-05-19 ENCOUNTER — Other Ambulatory Visit (HOSPITAL_COMMUNITY)
Admission: RE | Admit: 2022-05-19 | Discharge: 2022-05-19 | Disposition: A | Discharge status: Home / Self Care | Payer: BC Managed Care – PPO | Source: Ambulatory Visit | Attending: Obstetrics and Gynecology | Admitting: Obstetrics and Gynecology

## 2022-05-19 VITALS — Ht 62.0 in | Wt 120.0 lb

## 2022-05-19 DIAGNOSIS — N950 Postmenopausal bleeding: Secondary | ICD-10-CM | POA: Insufficient documentation

## 2022-05-19 LAB — POCT URINE PREGNANCY: Preg Test, Ur: NEGATIVE

## 2022-05-19 NOTE — Progress Notes (Signed)
NEW GYNECOLOGY VISIT  Subjective:  Caitlyn Perez is a 46 y.o. G2P2 with history of POI presenting for postmenopausal bleeding.  Diagnosed with POI in 2018 after having irregular periods, hot flashes, and vaginal dryness. Was on continuous COCs from 2018 until October. Had two episodes of vaginal bleeding after discontinuing COCs. Pelvic US on 04/21/22 with 5m EL, so was referred for EMB today.  I personally reviewed the notes from Dr. LGala Romneyon 08/27/16, 09/15/16, and 10/15/27 as well as the note from Dr. MMadilyn Firemanon 03/20/22. I personally reviewed the ultrasound dated 04/21/22.  PMH: migraine without aura, POI PSH: CS w/ BTL, umbilical hernia with mesh (2013), abdominoplasty (2022) Meds: rizatriptan prn, CBD gummies All: NKDA OB: Csx2 Pap Hx: 11/30/2018  Cytology - PAP  *Pap Smear: NILM *HPV: HRHPV - HPV testing in 5 years, Pap in 5 years Due Date: 11/30/2023  05/05/2016  Cytology - PAP  *Pap Smear: NILM *HPV: HRHPV -    01/27/2013  Cytology - PAP  *Pap Smear: NILM *HPV: HRHPV -   06/24/2011  Cervical or vaginal cytopath, thin prep  *Pap Smear: NILM *HPV: HRHPV -   Soc: denies t/d. +EtOH 1/wk FHx: denies hx br, ov, ut, colon ca  Objective:   Vitals:   05/19/22 1439  Weight: 120 lb (54.4 kg)  Height: '5\' 2"'$  (1.575 m)   General:  Alert, oriented and cooperative. Patient is in no acute distress.  Skin: Skin is warm and dry. No rash noted.   Cardiovascular: Normal heart rate noted  Respiratory: Normal respiratory effort, no problems with respiration noted  Abdomen: Soft, nontender  Pelvic:  Exam performed in presence of chaperone. NEFG. Normal vagina, cervix. Normal, mobile, nontender uterus. No adnexal masses.     Assessment and Plan:  Caitlyn YOTTis a 46y.o. with PMB  1. Postmenopausal bleeding EMB performed today (see note below) LH/FSH/E2 Questions about continuing HRT - will review labs/EMB and discuss pending results - Surgical pathology( [facility]  Return pending biopsy results.  KInez Catalina MD   GYNECOLOGY OFFICE PROCEDURE NOTE   Caitlyn VIVERETTEis a 46y.o. [id number] for endometrial biopsy for PMB. Recent ultrasound also showed EL 673motherwise normal.  Today, she reports no concerning symptoms. Of note, pap 2020 was NILM, negative HPV.   ENDOMETRIAL BIOPSY     The indications for endometrial biopsy were reviewed.   Risks of the biopsy including cramping, bleeding, infection, uterine perforation, inadequate specimen and need for additional procedures were discussed. Offered alternative of hysteroscopy, dilation and curettage in OR. The patient states she understands the R/B/I/A and agrees to undergo procedure today. Urine pregnancy test was Negative. Consent was signed. Time out was performed.    Patient was positioned in dorsal lithotomy position. A vaginal speculum was placed.  The cervix was visualized and was prepped with Betadine.  A single-toothed tenaculum was placed on the anterior lip of the cervix to stabilize it. The 3 mm pipelle was easily introduced into the endometrial cavity and a Moderate amount of tissue was obtained after two passes and sent to pathology. The instruments were removed from the patient's vagina. Minimal bleeding from the cervix was noted. The patient tolerated the procedure well.   Patient was given post procedure instructions.  Will follow up pathology and manage accordingly; patient will be contacted with results and recommendations.  Routine preventative health maintenance measures emphasized.   KyGale JourneyMD [facility]  [facility]

## 2022-05-19 NOTE — Patient Instructions (Addendum)
You will be able to see your results on MyChart. I will call you when everything is back.

## 2022-05-20 LAB — FSH/LH
FSH: 51.9 m[IU]/mL
LH: 17 m[IU]/mL

## 2022-05-20 LAB — ESTRADIOL: Estradiol: 36 pg/mL

## 2022-05-21 ENCOUNTER — Telehealth: Payer: Self-pay | Admitting: Obstetrics and Gynecology

## 2022-05-21 LAB — SURGICAL PATHOLOGY

## 2022-05-21 NOTE — Telephone Encounter (Signed)
Called to review lab and biopsy results.  FSH is elevated and estradiol is low consistent with menopause/menopausal transition.  Biopsy is benign.   We reviewed her labs from 2018 (high estrogen, low FSH) resulting in secondary amenorrhea and potentially menopausal transition. Discussed that oral contraceptives carry the benefit of pregnancy prevention (though she has had a tubal ligation), regular menstrual cycles or suppression of menstrual cycles, and protection of brain/heart/bone health. There may still be benefit from birth control pills or hormone replacement therapy, but this is not absolutely necessary.  We discussed that women can still occasionally ovulate, so if she had not gotten a tubal we would recommend contraception.   She reports mood changes since stopping birth control pills. We discussed pros/cons of seeing how her mood settles off COCs vs resuming them. For now, she would like to hold of on birth control pills.   Overall, suspect her bleeding is from discontinuation of COCs. We discussed that she could occasionally bleed with ovulation and discussed strategies for management including COCs and IUD. We will continue to monitor. If she ever goes > 1 year without bleeding (off hormonal contraceptives) and then has another episode, recommend repeat US to assess EL and/or endometrial biopsy.   Gale Journey, MD [place], [facility] for [employer], [place]

## 2023-03-02 ENCOUNTER — Other Ambulatory Visit: Payer: Self-pay | Admitting: Family Medicine

## 2023-03-02 DIAGNOSIS — Z1231 Encounter for screening mammogram for malignant neoplasm of breast: Secondary | ICD-10-CM

## 2023-04-13 ENCOUNTER — Encounter: Payer: Self-pay | Admitting: Family Medicine

## 2023-04-13 ENCOUNTER — Ambulatory Visit (INDEPENDENT_AMBULATORY_CARE_PROVIDER_SITE_OTHER): Payer: BC Managed Care – PPO | Admitting: Family Medicine

## 2023-04-13 VITALS — BP 115/65 | HR 76 | Ht 62.0 in | Wt 130.0 lb

## 2023-04-13 DIAGNOSIS — Z0000 Encounter for general adult medical examination without abnormal findings: Secondary | ICD-10-CM | POA: Diagnosis not present

## 2023-04-13 DIAGNOSIS — G43009 Migraine without aura, not intractable, without status migrainosus: Secondary | ICD-10-CM

## 2023-04-13 DIAGNOSIS — E349 Endocrine disorder, unspecified: Secondary | ICD-10-CM

## 2023-04-13 MED ORDER — RIZATRIPTAN BENZOATE 10 MG PO TBDP: 10.0000 mg | ORAL_TABLET | ORAL | 11 refills | Status: DC | PRN

## 2023-04-13 NOTE — Progress Notes (Signed)
Complete physical exam  Patient: Caitlyn Perez   DOB: [birth year]   47 y.o. Female  MRN: [id number]  Subjective:    Chief Complaint  Patient presents with   Annual Exam    Caitlyn Perez is a 47 y.o. female who presents today for a complete physical exam. She reports consuming a general diet.  Works out [employer]   She generally feels well. She reports sleeping poorly. She does not have additional problems to discuss today. Saw gyn last year.   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  But since December of last year she is actually been having very regular periods.   Most recent fall risk assessment:    04/13/2023    8:51 AM  Fall Risk   Falls in the past year? 0  Number falls in past yr: 0  Injury with Fall? 0  Risk for fall due to : No Fall Risks  Follow up Falls evaluation completed     Most recent depression screenings:    04/13/2023    8:51 AM 03/19/2022    8:28 AM  PHQ 2/9 Scores  PHQ - 2 Score 0 0  PHQ- 9 Score  0        Patient Care Team: Agapito Games, MD as PCP - General (Family Medicine)   Outpatient Medications Prior to Visit  Medication Sig   [DISCONTINUED] HAILEY 24 FE 1-20 MG-MCG(24) tablet TAKE 1 TABLET BY MOUTH EVERY DAY (Patient not taking: Reported on 05/19/2022)   [DISCONTINUED] rizatriptan (MAXALT-MLT) 10 MG disintegrating tablet Take 1 tablet (10 mg total) by mouth as needed for migraine. May repeat in 2 hours if needed   No facility-administered medications prior to visit.    ROS        Objective:     BP 115/65   Pulse 76   Ht 5\' 2"  (1.575 m)   Wt 130 lb (59 kg)   LMP 05/06/2022 (Approximate)   SpO2 100%   BMI 23.78 kg/m    Physical Exam Constitutional:      Appearance: Normal appearance.  HENT:     Head: Normocephalic and atraumatic.     Right Ear: Tympanic membrane, ear canal and external ear normal.     Left Ear: Tympanic membrane, ear canal and external  ear normal.     Nose: Nose normal.     Mouth/Throat:     Pharynx: Oropharynx is clear.  Eyes:     Extraocular Movements: Extraocular movements intact.     Conjunctiva/sclera: Conjunctivae normal.     Pupils: Pupils are equal, round, and reactive to light.  Neck:     Thyroid: No thyromegaly.  Cardiovascular:     Rate and Rhythm: Normal rate and regular rhythm.  Pulmonary:     Effort: Pulmonary effort is normal.     Breath sounds: Normal breath sounds.  Musculoskeletal:        General: No swelling.     Cervical back: Neck supple.  Skin:    General: Skin is warm and dry.  Neurological:     Mental Status: She is oriented to person, place, and time.  Psychiatric:        Mood and Affect: Mood normal.        Behavior: Behavior normal.      No results found for any visits on 04/13/23.     Assessment & Plan:    Routine Health Maintenance and Physical Exam  Immunization  History  Administered Date(s) Administered   Influenza,inj,Quad PF,6+ Mos 04/30/2017   PFIZER(Purple Top)SARS-COV-2 Vaccination 08/03/2019, 08/24/2019   PPD Test 12/26/2011, 01/12/2019   Tdap 12/25/2011, 03/19/2022    Health Maintenance  Topic Date Due   INFLUENZA VACCINE  08/31/2023 (Originally 01/01/2023)   COVID-19 Vaccine (3 - 2023-24 season) 04/28/2024 (Originally 02/01/2023)   Cervical Cancer Screening (HPV/Pap Cotest)  11/30/2023   Fecal DNA (Cologuard)  03/26/2025   DTaP/Tdap/Td (3 - Td or Tdap) 03/19/2032   Hepatitis C Screening  Completed   HIV Screening  Completed   Pneumococcal Vaccine 11-4 Years old  Aged Out   HPV VACCINES  Aged Out    Discussed health benefits of physical activity, and encouraged her to engage in regular exercise appropriate for her age and condition.  Problem List Items Addressed This Visit       Cardiovascular and Mediastinum   Migraine without aura, not intractable   Relevant Medications   rizatriptan (MAXALT-MLT) 10 MG disintegrating tablet   Other Visit  Diagnoses     Routine general medical examination at a health care facility    -  Primary   Relevant Orders   CMP14+EGFR   Lipid Panel With LDL/HDL Ratio   TSH   HgB U9W   CBC with Differential   Abnormality of hormone       Relevant Orders   Follicle stimulating hormone   Luteinizing hormone   Estradiol   Progesterone       Keep up a regular exercise program and make sure you are eating a healthy diet Try to eat 4 servings of dairy a day, or if you are lactose intolerant take a calcium with vitamin D daily.  Your vaccines are up to date.   Return in about 1 year (around 04/12/2024) for Wellness Exam.     Nani Gasser, MD

## 2023-04-14 LAB — LIPID PANEL WITH LDL/HDL RATIO
Cholesterol, Total: 193 mg/dL (ref 100–199)
HDL: 56 mg/dL (ref >39)
LDL Chol Calc (NIH): 112 mg/dL — ABNORMAL HIGH (ref 0–99)
LDL/HDL Ratio: 2 ratio (ref 0.0–3.2)
Triglycerides: 141 mg/dL (ref 0–149)
VLDL Cholesterol Cal: 25 mg/dL (ref 5–40)

## 2023-04-14 LAB — CBC WITH DIFFERENTIAL/PLATELET
Basophils Absolute: 0 10*3/uL (ref 0.0–0.2)
Basos: 0 %
EOS (ABSOLUTE): 0.5 10*3/uL — ABNORMAL HIGH (ref 0.0–0.4)
Eos: 9 %
Hematocrit: 44 % (ref 34.0–46.6)
Hemoglobin: 14.4 g/dL (ref 11.1–15.9)
Immature Grans (Abs): 0 10*3/uL (ref 0.0–0.1)
Immature Granulocytes: 0 %
Lymphocytes Absolute: 1.5 10*3/uL (ref 0.7–3.1)
Lymphs: 25 %
MCH: 31.9 pg (ref 26.6–33.0)
MCHC: 32.7 g/dL (ref 31.5–35.7)
MCV: 97 fL (ref 79–97)
Monocytes Absolute: 0.5 10*3/uL (ref 0.1–0.9)
Monocytes: 9 %
Neutrophils Absolute: 3.3 10*3/uL (ref 1.4–7.0)
Neutrophils: 57 %
Platelets: 219 10*3/uL (ref 150–450)
RBC: 4.52 x10E6/uL (ref 3.77–5.28)
RDW: 11.8 % (ref 11.7–15.4)
WBC: 5.8 10*3/uL (ref 3.4–10.8)

## 2023-04-14 LAB — CMP14+EGFR
ALT: 13 [IU]/L (ref 0–32)
AST: 15 [IU]/L (ref 0–40)
Albumin: 4.1 g/dL (ref 3.9–4.9)
Alkaline Phosphatase: 59 [IU]/L (ref 44–121)
BUN/Creatinine Ratio: 17 (ref 9–23)
BUN: 13 mg/dL (ref 6–24)
Bilirubin Total: 0.3 mg/dL (ref 0.0–1.2)
CO2: 25 mmol/L (ref 20–29)
Calcium: 9.3 mg/dL (ref 8.7–10.2)
Chloride: 103 mmol/L (ref 96–106)
Creatinine, Ser: 0.76 mg/dL (ref 0.57–1.00)
Globulin, Total: 2.3 g/dL (ref 1.5–4.5)
Glucose: 91 mg/dL (ref 70–99)
Potassium: 4.7 mmol/L (ref 3.5–5.2)
Sodium: 140 mmol/L (ref 134–144)
Total Protein: 6.4 g/dL (ref 6.0–8.5)
eGFR: 98 mL/min/{1.73_m2} (ref >59)

## 2023-04-14 LAB — PROGESTERONE: Progesterone: 0.1 ng/mL

## 2023-04-14 LAB — ESTRADIOL: Estradiol: 201 pg/mL

## 2023-04-14 LAB — HEMOGLOBIN A1C
Est. average glucose Bld gHb Est-mCnc: 100 mg/dL
Hgb A1c MFr Bld: 5.1 % (ref 4.8–5.6)

## 2023-04-14 LAB — TSH: TSH: 2.47 u[IU]/mL (ref 0.450–4.500)

## 2023-04-14 LAB — FOLLICLE STIMULATING HORMONE: FSH: 5.1 m[IU]/mL

## 2023-04-14 LAB — LUTEINIZING HORMONE: LH: 3.1 m[IU]/mL

## 2023-04-14 NOTE — Progress Notes (Signed)
Hi Caitlyn Perez, Your HDL looks great this time.  Your blood count is normal. No anemia. Thyroid and metabolic panel are normal. No diabetes or prediabetes. Your hormones look like you are not menopausal.  The FSH and LH are back down and the estradiol is back up.  Which is what we typically see in women who are still menstruating and having periods.  Progesterone still little low.  Would you like [place] to forward this to your OB/GYN?

## 2023-04-29 ENCOUNTER — Ambulatory Visit: Payer: BC Managed Care – PPO

## 2023-04-29 DIAGNOSIS — Z1231 Encounter for screening mammogram for malignant neoplasm of breast: Secondary | ICD-10-CM

## 2023-05-04 NOTE — Progress Notes (Signed)
Please call patient. Normal mammogram.  Repeat in 1 year.  

## 2023-07-14 ENCOUNTER — Telehealth: Payer: Self-pay | Admitting: Family Medicine

## 2023-07-14 NOTE — Telephone Encounter (Unsigned)
Copied from CRM [id number]. Topic: General - Billing Inquiry >> Jul 14, 2023  1:45 PM Gildardo Pounds wrote: Reason for CRM: Patient following up on changing how the blood work was coded from appointment on 11/11/224 so that [employer] will cover it. Callback number is [phone]. Patient has called on this previously. Please follow up with the patient once it has been completed.

## 2023-08-06 ENCOUNTER — Telehealth: Payer: Self-pay | Admitting: Family Medicine

## 2023-08-06 NOTE — Telephone Encounter (Signed)
 Copied from CRM [id number]. Topic: Medical Record Request - Payor/Billing Request >> Aug 06, 2023 11:34 AM Bobbye Morton wrote: Reason for CRM: Pt called in because PT had blood work that was performed 04/2023 and the blood work order was coded as "preventive" and not "Medical." Pt has not received an update regarding the matter and continues to receive past due bills. Pt is requesting callback [phone]   Called [place] and spoke to [facility] who sent billing a message to give pt a callback.

## 2023-08-07 ENCOUNTER — Telehealth: Payer: Self-pay | Admitting: Emergency Medicine

## 2023-08-07 NOTE — Telephone Encounter (Signed)
 Spoke to Ms. Caitlyn Perez , she will contact the insurance call me back on three way so that we can find out the Lab balance issue.

## 2023-08-07 NOTE — Telephone Encounter (Signed)
 Source  Caitlyn Perez (Patient)   Subject  Caitlyn Perez (Patient)   Topic  General - Other     Communication  Reason for CRM: Patient is calling in returning a call from Angelica Littlejohn.

## 2023-08-07 NOTE — Telephone Encounter (Signed)
 error

## 2023-08-14 NOTE — Telephone Encounter (Signed)
 Spoke with patient to explain her [facility] bill charges. She is good now

## 2024-05-05 ENCOUNTER — Other Ambulatory Visit: Payer: Self-pay | Admitting: Family Medicine

## 2024-05-05 DIAGNOSIS — G43009 Migraine without aura, not intractable, without status migrainosus: Secondary | ICD-10-CM

## 2024-07-05 ENCOUNTER — Encounter: Payer: Self-pay | Admitting: Family Medicine

## 2024-08-04 ENCOUNTER — Encounter: Payer: Self-pay | Admitting: Family Medicine
